# Patient Record
Sex: Male | Born: 1965 | Race: Black or African American | Hispanic: No | Smoking: Never smoker
Health system: Southern US, Community
[De-identification: ages and names within clinical notes are randomized; demographics above are authoritative.]

## PROBLEM LIST (undated history)

## (undated) DIAGNOSIS — D6851 Activated protein C resistance: Secondary | ICD-10-CM

## (undated) DIAGNOSIS — I2699 Other pulmonary embolism without acute cor pulmonale: Secondary | ICD-10-CM

## (undated) DIAGNOSIS — D869 Sarcoidosis, unspecified: Secondary | ICD-10-CM

## (undated) DIAGNOSIS — K7460 Unspecified cirrhosis of liver: Secondary | ICD-10-CM

## (undated) HISTORY — PX: CAROTID STENT: SHX1301

---

## 2001-07-06 DIAGNOSIS — D869 Sarcoidosis, unspecified: Secondary | ICD-10-CM

## 2004-05-31 ENCOUNTER — Emergency Department (HOSPITAL_COMMUNITY): Admission: EM | Admit: 2004-05-31 | Discharge: 2004-05-31 | Payer: Self-pay | Admitting: Emergency Medicine

## 2005-05-15 ENCOUNTER — Other Ambulatory Visit: Payer: Self-pay

## 2005-05-15 ENCOUNTER — Emergency Department: Payer: Self-pay | Admitting: Unknown Physician Specialty

## 2006-01-22 ENCOUNTER — Emergency Department: Payer: Self-pay | Admitting: Emergency Medicine

## 2006-07-18 ENCOUNTER — Emergency Department: Payer: Self-pay | Admitting: General Practice

## 2007-04-28 ENCOUNTER — Emergency Department: Payer: Self-pay | Admitting: Emergency Medicine

## 2007-04-28 ENCOUNTER — Other Ambulatory Visit: Payer: Self-pay

## 2007-08-09 DIAGNOSIS — I5189 Other ill-defined heart diseases: Secondary | ICD-10-CM

## 2008-07-13 ENCOUNTER — Other Ambulatory Visit: Payer: Self-pay

## 2008-07-13 ENCOUNTER — Emergency Department: Payer: Self-pay | Admitting: Emergency Medicine

## 2008-11-24 DIAGNOSIS — I749 Embolism and thrombosis of unspecified artery: Secondary | ICD-10-CM

## 2008-11-27 DIAGNOSIS — N182 Chronic kidney disease, stage 2 (mild): Secondary | ICD-10-CM | POA: Insufficient documentation

## 2008-11-27 DIAGNOSIS — I10 Essential (primary) hypertension: Secondary | ICD-10-CM

## 2009-06-28 ENCOUNTER — Emergency Department: Payer: Self-pay | Admitting: Internal Medicine

## 2009-08-08 ENCOUNTER — Emergency Department: Payer: Self-pay | Admitting: Emergency Medicine

## 2009-08-10 ENCOUNTER — Emergency Department: Payer: Self-pay | Admitting: Emergency Medicine

## 2010-12-23 ENCOUNTER — Emergency Department: Payer: Self-pay | Admitting: Unknown Physician Specialty

## 2010-12-27 ENCOUNTER — Emergency Department: Payer: Self-pay | Admitting: Unknown Physician Specialty

## 2011-10-26 ENCOUNTER — Observation Stay: Payer: Self-pay | Admitting: Specialist

## 2011-10-26 LAB — CBC
MCH: 30.9 pg (ref 26.0–34.0)
MCHC: 34.4 g/dL (ref 32.0–36.0)
Platelet: 128 10*3/uL — ABNORMAL LOW (ref 150–440)
RBC: 4.32 10*6/uL — ABNORMAL LOW (ref 4.40–5.90)

## 2011-10-26 LAB — COMPREHENSIVE METABOLIC PANEL
Albumin: 4.2 g/dL (ref 3.4–5.0)
Alkaline Phosphatase: 126 U/L (ref 50–136)
Calcium, Total: 9.7 mg/dL (ref 8.5–10.1)
Co2: 28 mmol/L (ref 21–32)
EGFR (Non-African Amer.): 36 — ABNORMAL LOW
Osmolality: 283 (ref 275–301)
SGPT (ALT): 26 U/L
Sodium: 140 mmol/L (ref 136–145)

## 2011-10-26 LAB — CK TOTAL AND CKMB (NOT AT ARMC)
CK, Total: 77 U/L (ref 35–232)
CK-MB: 1.4 ng/mL (ref 0.5–3.6)
CK-MB: 0.9 ng/mL (ref 0.5–3.6)

## 2011-10-26 LAB — TROPONIN I
Troponin-I: <0.02 ng/mL
Troponin-I: <0.02 ng/mL

## 2011-10-27 LAB — LIPID PANEL
HDL Cholesterol: 60 mg/dL (ref 40–60)
Ldl Cholesterol, Calc: 60 mg/dL (ref 0–100)
Triglycerides: 59 mg/dL (ref 0–200)
VLDL Cholesterol, Calc: 12 mg/dL (ref 5–40)

## 2011-10-27 LAB — CBC WITH DIFFERENTIAL/PLATELET
Basophil #: 0 10*3/uL (ref 0.0–0.1)
Eosinophil %: 3.5 %
HCT: 35.6 % — ABNORMAL LOW (ref 40.0–52.0)
HGB: 12.2 g/dL — ABNORMAL LOW (ref 13.0–18.0)
Lymphocyte #: 2.3 10*3/uL (ref 1.0–3.6)
MCH: 30.7 pg (ref 26.0–34.0)
MCV: 90 fL (ref 80–100)
Monocyte #: 0.5 10*3/uL (ref 0.0–0.7)
Monocyte %: 6.4 %
Neutrophil #: 4.2 10*3/uL (ref 1.4–6.5)
Platelet: 129 10*3/uL — ABNORMAL LOW (ref 150–440)
RBC: 3.97 10*6/uL — ABNORMAL LOW (ref 4.40–5.90)
WBC: 7.3 10*3/uL (ref 3.8–10.6)

## 2011-10-27 LAB — BASIC METABOLIC PANEL
Anion Gap: 10 (ref 7–16)
BUN: 24 mg/dL — ABNORMAL HIGH (ref 7–18)
Chloride: 106 mmol/L (ref 98–107)
Creatinine: 1.91 mg/dL — ABNORMAL HIGH (ref 0.60–1.30)
EGFR (Non-African Amer.): 41 — ABNORMAL LOW
Osmolality: 285 (ref 275–301)
Potassium: 4.2 mmol/L (ref 3.5–5.1)

## 2011-10-27 LAB — CK TOTAL AND CKMB (NOT AT ARMC): CK, Total: 69 U/L (ref 35–232)

## 2011-10-27 LAB — HEMOGLOBIN A1C: Hemoglobin A1C: 5.3 % (ref 4.2–6.3)

## 2012-03-18 ENCOUNTER — Emergency Department: Payer: Self-pay | Admitting: Emergency Medicine

## 2012-03-18 LAB — URINALYSIS, COMPLETE
Glucose,UR: NEGATIVE mg/dL (ref 0–75)
Nitrite: NEGATIVE
Protein: >=500
Specific Gravity: 1.01 (ref 1.003–1.030)
Squamous Epithelial: 1
WBC UR: 4 /HPF (ref 0–5)

## 2012-03-18 LAB — COMPREHENSIVE METABOLIC PANEL
Albumin: 3.5 g/dL (ref 3.4–5.0)
Bilirubin,Total: 1 mg/dL (ref 0.2–1.0)
Chloride: 110 mmol/L — ABNORMAL HIGH (ref 98–107)
Creatinine: 2.04 mg/dL — ABNORMAL HIGH (ref 0.60–1.30)
EGFR (African American): 44 — ABNORMAL LOW
EGFR (Non-African Amer.): 38 — ABNORMAL LOW
Glucose: 88 mg/dL (ref 65–99)
Osmolality: 290 (ref 275–301)
SGOT(AST): 25 U/L (ref 15–37)
Sodium: 143 mmol/L (ref 136–145)

## 2012-03-18 LAB — CBC
HGB: 11.8 g/dL — ABNORMAL LOW (ref 13.0–18.0)
MCH: 30.4 pg (ref 26.0–34.0)
MCV: 91 fL (ref 80–100)
Platelet: 135 10*3/uL — ABNORMAL LOW (ref 150–440)
WBC: 6 10*3/uL (ref 3.8–10.6)

## 2013-01-31 DIAGNOSIS — Z0000 Encounter for general adult medical examination without abnormal findings: Secondary | ICD-10-CM | POA: Insufficient documentation

## 2013-08-09 DIAGNOSIS — Z9119 Patient's noncompliance with other medical treatment and regimen: Secondary | ICD-10-CM | POA: Insufficient documentation

## 2014-03-24 DIAGNOSIS — N529 Male erectile dysfunction, unspecified: Secondary | ICD-10-CM | POA: Insufficient documentation

## 2014-05-29 DIAGNOSIS — E785 Hyperlipidemia, unspecified: Secondary | ICD-10-CM | POA: Insufficient documentation

## 2015-02-07 NOTE — H&P (Signed)
PATIENT NAME:  Christian Fuentes, Christian Fuentes MR#:  [id number] DATE OF BIRTH:  13-Sep-1966  DATE OF ADMISSION:  10/26/2011  REFERRING PHYSICIAN: Dana AllanMarwan Powers, MD    [facility]   [facility]   CHIEF COMPLAINT: Chest pain.    HISTORY OF PRESENT ILLNESS: The patient is a 49 year old African American male with a history of hypertension, chronic kidney disease, and sarcoid that is in remission, presents with chest pain that lasted less than a minute today. Of note, the  patient has been working out for the first time ever in a gym, including free weights and upper body exercises as well as lower body. Today he noted chest pain in the left chest about an hour before arrival. It was worse with movement and positional. There was no radiation, weakness, fatigue,  blurry vision, double vision, or shortness of breath or diaphoresis. On arrival here, blood pressure was elevated to 180s/110s. He has no chest pain currently. The Hospitalist Services were contacted for admission for observation overnight and ruling out cardiac chest pain.   PAST MEDICAL HISTORY:  1. Chronic kidney disease.  2. Hypertension.  3. 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  4. Chronic thrombocytopenia.   PAST SURGICAL HISTORY: The patient denies.   SOCIAL HISTORY: The patient denies tobacco, alcohol or drug use.   FAMILY HISTORY: Denies family history of coronary artery disease, hypertension, diabetes, myocardial infarction and strokes.   MEDICATIONS:  1. Amlodipine 10 mg daily.  2. Enalapril 20 mg b.i.d.   ALLERGIES: Denies.   REVIEW OF SYSTEMS: CONSTITUTIONAL: No fever, fatigue, weakness. Pain as above. No weight changes. EYES: No blurry vision, double vision or inflammation. ENT: No tinnitus or hearing loss. RESPIRATORY: No cough, wheezing, hemoptysis, or chronic obstructive pulmonary disease. CARDIOVASCULAR: Chest pain as above. No chest pain now. No  orthopnea, edema, arrhythmia, or syncope. The patient does have history of high blood pressure. GASTROINTESTINAL: No nausea, vomiting, diarrhea, abdominal pain, rectal bleeding or melena. GU: No dysuria, hematuria, or frequency. ENDOCRINE: No polyuria, nocturia, or thyroid problems. HEME/LYMPH: No anemia. SKIN: No rashes. MUSCULOSKELETAL: No history of arthritis or swelling. NEUROLOGIC: No numbness, weakness, dementia or ataxia. PSYCHIATRIC: No anxiety or insomnia.   PHYSICAL EXAMINATION:  VITAL SIGNS: Temperature 98.9, pulse 70, respiratory rate 18, blood pressure currently 151/90, on arrival 189/110, oxygen saturation 99% on room air.   GENERAL: The patient is a pleasant African American male lying in bed in no obvious distress.   HEENT: Normocephalic, atraumatic. Pupils are equal and reactive. Anicteric sclerae. Moist mucous membranes. Poor dentition.   NECK: Supple. No JVD.   CARDIOVASCULAR: S1, S2, regular rate and rhythm. No murmurs, rubs, or gallops.   LUNGS: Clear to auscultation without wheezing or rales.   ABDOMEN: Soft, nontender, no organomegaly noted.   EXTREMITIES: No significant edema. Cranial nerves II through XII are grossly intact.   NEUROLOGICAL: Strength 5 out of 5 in all extremities. Sensation intact.   PSYCHIATRIC: Awake, alert, oriented x3. Mood is appropriate.    LABORATORY, DIAGNOSTIC AND RADIOLOGICAL DATA:  Sodium 140, creatinine 2.1. It was 2.41 in September 2010, BUN 25, glucose 84, potassium 4.1. LFTs: Total bilirubin 1.3, otherwise within normal limits.  Troponin less than 0.02. CK-MB 1.4. CK total 132.  WBC 7.7, hemoglobin 13.4, hematocrit 38.3, platelets 128, was 132 in 2010.  EKG: Sinus rate 73, normal PR, QRS, and QT durations. No acute ST elevation or depressions. Nonspecific T wave abnormalities.  X-ray of the chest, PA and lateral: No significant abnormalities noted.   ASSESSMENT AND PLAN: We have a 49 year old [place] American male with a history  of hypertension, sarcoid, CKD, presents with chest pain. At this point, we will admit the patient for observation overnight and rule out myocardial infarction. The patient has hypertension and on arrival had hypertensive urgency. The patient also has sarcoid which can predispose the patient to early coronary artery disease. We would completely  rule out myocardial infarction by cyclic cardiac markers, echo and would consider a stress test. However, a more likely scenario is that the patient has a musculoskeletal injury from working out in the gym. This was her first time, and the pain is reproducible, positional. He has some tenderness to palpation on the left upper chest. I would check a fasting lipid panel and admit the patient to telemetry and would likely discharge if the above work-up is negative. For his high blood pressure, appears to be somewhat suboptimal. I would add a beta blocker and monitor his blood pressure. He does have elevated BUN and creatinine. He states that he follows with a nephrologist at [facility]. Perhaps this is chronic kidney disease, and we would monitor this in the morning. He had an elevated BUN and creatinine as well in 2010, and there are no other labs since then here. He does have some thrombocytopenia which again appears to be chronic in nature, and this would be monitored until hemoglobin and hematocrit are stable.   CODE STATUS:  The patient is a FULL CODE.     TOTAL TIME SPENT: 45 MINUTES.   ____________________________ Krystal Eaton, MD sa:cbb D: 10/26/2011 14:36:09 ET T: 10/26/2011 15:31:18 ET JOB#: [id number]  cc: Krystal Eaton, MD, <Dictator> [facility] Marcelle Smiling [facility] MD ELECTRONICALLY SIGNED 10/27/2011 20:38

## 2015-02-07 NOTE — Consult Note (Signed)
PATIENT NAME:  Christian Fuentes, Christian Fuentes MR#:  [id number] DATE OF BIRTH:  1966/01/27  DATE OF CONSULTATION:  10/27/2011  REFERRING PHYSICIAN:  Dana AllanMarwan Powers, MD  and [facility] CONSULTING PHYSICIAN:  Dwayne D. Callwood, MD PRIMARY CARE PHYSICIAN[facility]  INDICATIONS: Chest pain, hypertension and shortness of breath.    HISTORY OF PRESENT ILLNESS: Christian Fuentes is a 49 year old African-American male with a history of hypertension, chronic renal insufficiency, sarcoid disease in remission, complained of chest pain over the last several days lasting for several minutes. The patient has been working out for the first time at a gym using free weights and upper body exercises as well as lower body. He started having chest pain, left-sided, about an hour prior to arrival. It was worse with movement and positional. No radiation, weakness or fatigue, blurred vision. He had shortness of breath and diaphoresis with the chest pain, and upon arrival blood pressure was malignantly high. The chest pain has improved, but he was admitted for further evaluation.   REVIEW OF SYSTEMS: No blackout spells or syncope. No nausea or vomiting. No fever, no chills, no sweats. No weight loss or weight gain. No hemoptysis, hematemesis. No bright red blood per rectum.   PAST MEDICAL HISTORY:  1. Chronic renal insufficiency.  2. Hypertension. 3. Sarcoid.  4. Thrombocytopenia.   PAST SURGICAL HISTORY: None.   SOCIAL HISTORY: No smoking or alcohol consumption.   FAMILY HISTORY: Negative for coronary artery disease and diabetes.   MEDICATIONS:  1. Amlodipine 10 mg a day.  2. Enalapril 20 mg b.i.d.     ALLERGIES: None.   PHYSICAL EXAMINATION:  VITAL SIGNS: Blood pressure 160/90, pulse 75, respiratory rate 16, afebrile.   HEENT: Normocephalic, atraumatic. Pupils are equal and reactive to light.   NECK: Supple. No jugular venous distention, bruits, or adenopathy.   LUNGS: Clear to  auscultation and percussion. No significant wheezing or rales. Some mild rhonchi.   HEART: Regular rate and rhythm. Positive S4. Systolic ejection murmur at the left sternal border.   ABDOMEN: Exam is benign.   EXTREMITIES: Exam within normal limits.   NEUROLOGIC: Exam is intact.   SKIN: Exam is normal.   LABORATORY, DIAGNOSTIC AND RADIOLOGICAL DATA:  1. Sodium 140, creatinine 2.1.  2. LFTs essentially negative.  3. Troponin 0.02. CK 132, MB 1.4.  4. White count 7.7, hemoglobin 13, hematocrit 38, platelet count of 128.  5. EKG: Normal sinus rhythm, nonspecific ST-T wave changes.  6. Chest x-ray essentially negative.   ASSESSMENT:  1. Shortness of breath. 2. Chest pain.  3. Renal insufficiency.  4. Hypertension.  5. Sarcoid disease. 6. History of mild thrombocytopenia.   PLAN:  1. I agree with admit, rule out for myocardial infarction. Consider functional study. Follow-up cardiac enzymes. Follow-up EKG. I would consider echocardiogram.  2. Consider follow-up evaluation of renal insufficiency.  3. Control blood pressure. May increase current doses of medications.  4. Follow-up thrombocytopenia.  5. Sarcoid appears to be in remission. We will have him follow up with his regular sarcoid doctor.  6. Functional study. If that is normal, I would probably treat the patient medically for now unless symptoms persist, worsen, or recur. I would continue current therapy in the interim.   ____________________________ Bobbie Stackwayne D. Juliann Paresallwood, MD ddc:cbb D: 11/20/2011 13:47:59 ET T: 11/20/2011 17:24:59 ET JOB#: [id number]  cc: Dwayne D. Juliann Paresallwood, MD, <Dictator> Alwyn PeaWAYNE D CALLWOOD MD ELECTRONICALLY SIGNED 11/28/2011 12:51

## 2015-02-07 NOTE — Discharge Summary (Signed)
PATIENT NAME:  Christian Fuentes, Bon J MR#:  [id number] DATE OF BIRTH:  August 03, 1966  DATE OF ADMISSION:  10/26/2011 DATE OF DISCHARGE:  10/27/2011  For a detailed note, please take a look at the history and physical done on admission by Dr. Jacques NavyAhmadzia.   DIAGNOSES AT DISCHARGE:  1. Chest pain likely related to possible underlying GI issues and maybe some gas.  2. Hypertension.  3. History of sarcoidosis.   DIET: The patient is being discharged on a low sodium diet.   ACTIVITY: As tolerated.   FOLLOW-UP: [facility].   DISCHARGE MEDICATIONS:  1. Enalapril 20 mg b.i.d.  2. Amlodipine 10 mg daily.   PERTINENT STUDIES DONE DURING THE HOSPITAL COURSE: Chest x-ray done on admission showed no acute cardiopulmonary disease. A stress test on the day after admission showed submaximal study due to failure to achieve target heart rate. EKG was nondiagnostic due to failure to achieve target heart rate. Myoview showed no evidence of stress induced myocardial ischemia with submax stress. EF 53%. No wall motion abnormalities.   HOSPITAL COURSE: This is a 49 year old male with medical problems as mentioned above who presented to the hospital with chest pain.  1. Chest pain. He was observed overnight on telemetry. The patient had three sets of cardiac markers checked which were negative. He underwent a stress test, although the stress test was suboptimal but despite a suboptimal test he did not have any chest pain. He did not have any evidence of wall motion abnormalities or ischemia. Since he is currently asymptomatic, he will be discharged home. He was advised that if his chest pain recurs or if he has frequent symptoms to come back to the hospital at which point he would likely benefit from a cardiac catheterization.  2. History of sarcoidosis. The patient has no acute pulmonary issues. This is currently stable.  3. Hypertension. The patient remained hemodynamically  stable. Will resume his enalapril and his Norvasc as stated above.  4. History of chronic renal disease. The patient does have history of stage III chronic renal disease. His creatinine was pretty much at his baseline. This should be further followed up by [facility]. The patient is currently asymptomatic doing well with no active chest pain and, therefore, is being discharged home.   TIME SPENT: 35 minutes.   ____________________________ Rolly PancakeVivek J. Cherlynn KaiserSainani, MD vjs:drc D: 10/27/2011 15:46:36 ET T: 10/28/2011 15:47:17 ET JOB#: [id number]  cc: Rolly PancakeVivek J. Cherlynn KaiserSainani, MD, <Dictator> Houston SirenVIVEK J SAINANI MD ELECTRONICALLY SIGNED 10/30/2011 16:42

## 2015-02-27 ENCOUNTER — Encounter: Payer: Self-pay | Admitting: Emergency Medicine

## 2015-02-27 ENCOUNTER — Emergency Department
Admission: EM | Admit: 2015-02-27 | Discharge: 2015-02-27 | Disposition: A | Discharge status: Home / Self Care | Payer: PRIVATE HEALTH INSURANCE | Attending: Emergency Medicine | Admitting: Emergency Medicine

## 2015-02-27 ENCOUNTER — Emergency Department: Payer: PRIVATE HEALTH INSURANCE

## 2015-02-27 DIAGNOSIS — X58XXXA Exposure to other specified factors, initial encounter: Secondary | ICD-10-CM | POA: Diagnosis not present

## 2015-02-27 DIAGNOSIS — Z7901 Long term (current) use of anticoagulants: Secondary | ICD-10-CM | POA: Insufficient documentation

## 2015-02-27 DIAGNOSIS — Y998 Other external cause status: Secondary | ICD-10-CM | POA: Insufficient documentation

## 2015-02-27 DIAGNOSIS — Y9301 Activity, walking, marching and hiking: Secondary | ICD-10-CM | POA: Diagnosis not present

## 2015-02-27 DIAGNOSIS — Y9289 Other specified places as the place of occurrence of the external cause: Secondary | ICD-10-CM | POA: Diagnosis not present

## 2015-02-27 DIAGNOSIS — S93402A Sprain of unspecified ligament of left ankle, initial encounter: Secondary | ICD-10-CM | POA: Diagnosis not present

## 2015-02-27 DIAGNOSIS — S99912A Unspecified injury of left ankle, initial encounter: Secondary | ICD-10-CM | POA: Diagnosis present

## 2015-02-27 HISTORY — DX: Other pulmonary embolism without acute cor pulmonale: I26.99

## 2015-02-27 NOTE — ED Provider Notes (Signed)
[facility] Emergency Department Provider Note ?____________________________________________ ? Time seen: 1:48 PM on 02/27/2015 -----------------------------------------  I have reviewed the triage vital signs and the nursing notes. ________ HISTORY ? Chief Complaint Ankle Pain  HPI  Christian Fuentes is a 49 y.o. male who reports to the ED with acute left ankle pain, after a twisting injury today. He describes walking down the steps at a friend's house, when he missed a step, causing him to tweak his left ankle. This happened about 12 noon today. He denies any other injury. He notes some pain with ambulation and some swelling to the lateral side of the ankle. He denies a previous history of ankle problems.   Past Medical History  Diagnosis Date  . Pulmonary embolism    There are no active problems to display for this patient. ? History reviewed. No pertinent past surgical history. ? Current Outpatient Rx  Name  Route  Sig  Dispense  Refill  . warfarin (COUMADIN) 6 MG tablet   Oral   Take 6 mg by mouth daily. 6mg  [employer] [employer], Tues, Thurs & 9mg  Mon, Wed, Fri, Sat         ? Allergies Review of patient's allergies indicates no known allergies. ? History reviewed. No pertinent family history. ? Social History History  Substance Use Topics  . Smoking status: Never Smoker   . Smokeless tobacco: Never Used  . Alcohol Use: No   Review of Systems  Constitutional: Negative for fever. HEENT: Negative for head trauma, visual changes, sore throat. Cardiovascular: Negative for chest pain. Respiratory: Negative for shortness of breath. Musculoskeletal: Negative for back pain. Positive for left ankle pain. Skin: Negative for rash. Neurological: Negative for headaches, focal weakness or numbness.  10-point ROS otherwise negative. ____________________________________________  PHYSICAL EXAM:  VITAL SIGNS: ED Triage Vitals  Enc Vitals Group     BP 02/27/15  1309 143/79 mmHg     Pulse Rate 02/27/15 1309 79     Resp 02/27/15 1309 20     Temp 02/27/15 1309 97.6 F (36.4 C)     Temp Source 02/27/15 1309 Oral     SpO2 02/27/15 1309 98 %     Weight 02/27/15 1309 190 lb (86.183 kg)     Height 02/27/15 1309 5\' 9"  (1.753 m)     Head Cir --      Peak Flow --      Pain Score 02/27/15 1309 8     Pain Loc --      Pain Edu? --      Excl. in GC? --    Constitutional: Alert and oriented. Well appearing and in no distress. HEENT:Normocephalic and atraumatic.  PERRL. Normal extraocular movements.  No congestion/rhinnorhea. Mucous membranes are moist. Neck: Supple. No cervical lymphadenopathy. Cardiovascular: Normal rate, regular rhythm. No murmurs, rubs, or gallops. Normal and symmetric distal pulses are present in all extremities.  Respiratory: Normal respiratory effort without tachypnea. Breath sounds are clear and equal bilaterally. No wheezes/rales/rhonchi. Gastrointestinal: Soft and nontender. No distention. No abdominal bruits. There is no CVA tenderness. Musculoskeletal: Nontender with normal range of motion in all extremities.Left ankle shows minimal lateral soft tissue swelling at the malleolus. There is no obvious deformity, joint effusion, or erythema to the left foot and ankle patient with normal ankle range of motion, and negative drawer. He has no calf or Achilles tenderness. No lateral foot pain tenderness. Neurologic:  Normal speech and language. CN II-XII grossly intact. Mildly antalgic gait without instability. Skin:  Skin is warm, dry and intact. No rash noted. Psychiatric: Mood and affect are normal. Patient exhibits appropriate insight and judgment. ____________ RADIOLOGY  Left Ankle  IMPRESSION: Negative. _____________ PROCEDURES ? Procedure(s) performed: None  Critical Care performed: None ______________________________________________________ INITIAL IMPRESSION / ASSESSMENT AND PLAN / ED COURSE ? Grade I ankle sprain on  the left. Ace bandage applied. RICE instructions given to the patient.   Pertinent labs & imaging results that were available during my care of the patient were reviewed by me and considered in my medical decision making (see chart for details).  ____________________________________________ FINAL CLINICAL IMPRESSION(S) / ED DIAGNOSES?  Final diagnoses:  Ankle sprain, left, initial encounter      Lissa HoardJenise V Bacon Eberardo Demello, PA-C 02/27/15 1919

## 2015-02-27 NOTE — Discharge Instructions (Signed)
Ankle Sprain °An ankle sprain is an injury to the strong, fibrous tissues (ligaments) that hold the bones of your ankle joint together.  °CAUSES °An ankle sprain is usually caused by a fall or by twisting your ankle. Ankle sprains most commonly occur when you step on the outer edge of your foot, and your ankle turns inward. People who participate in sports are more prone to these types of injuries.  °SYMPTOMS  °· Pain in your ankle. The pain may be present at rest or only when you are trying to stand or walk. °· Swelling. °· Bruising. Bruising may develop immediately or within 1 to 2 days after your injury. °· Difficulty standing or walking, particularly when turning corners or changing directions. °DIAGNOSIS  °Your caregiver will ask you details about your injury and perform a physical exam of your ankle to determine if you have an ankle sprain. During the physical exam, your caregiver will press on and apply pressure to specific areas of your foot and ankle. Your caregiver will try to move your ankle in certain ways. An X-ray exam may be done to be sure a bone was not broken or a ligament did not separate from one of the bones in your ankle (avulsion fracture).  °TREATMENT  °Certain types of braces can help stabilize your ankle. Your caregiver can make a recommendation for this. Your caregiver may recommend the use of medicine for pain. If your sprain is severe, your caregiver may refer you to a surgeon who helps to restore function to parts of your skeletal system (orthopedist) or a physical therapist. °HOME CARE INSTRUCTIONS  °· Apply ice to your injury for 1-2 days or as directed by your caregiver. Applying ice helps to reduce inflammation and pain. °¨ Put ice in a plastic bag. °¨ Place a towel between your skin and the bag. °¨ Leave the ice on for 15-20 minutes at a time, every 2 hours while you are awake. °· Only take over-the-counter or prescription medicines for pain, discomfort, or fever as directed by  your caregiver. °· Elevate your injured ankle above the level of your heart as much as possible for 2-3 days. °· If your caregiver recommends crutches, use them as instructed. Gradually put weight on the affected ankle. Continue to use crutches or a cane until you can walk without feeling pain in your ankle. °· If you have a plaster splint, wear the splint as directed by your caregiver. Do not rest it on anything harder than a pillow for the first 24 hours. Do not put weight on it. Do not get it wet. You may take it off to take a shower or bath. °· You may have been given an elastic bandage to wear around your ankle to provide support. If the elastic bandage is too tight (you have numbness or tingling in your foot or your foot becomes cold and blue), adjust the bandage to make it comfortable. °· If you have an air splint, you may blow more air into it or let air out to make it more comfortable. You may take your splint off at night and before taking a shower or bath. Wiggle your toes in the splint several times per day to decrease swelling. °SEEK MEDICAL CARE IF:  °· You have rapidly increasing bruising or swelling. °· Your toes feel extremely cold or you lose feeling in your foot. °· Your pain is not relieved with medicine. °SEEK IMMEDIATE MEDICAL CARE IF: °· Your toes are numb or blue. °·   You have severe pain that is increasing. MAKE SURE YOU:   Understand these instructions.  Will watch your condition.  Will get help right away if you are not doing well or get worse. Document Released: 10/02/2005 Document Revised: 06/26/2012 Document Reviewed: 10/14/2011 [facility] Patient Information 2015 [employer] [place] [place]. This information is not intended to replace advice given to you by your health care provider. Make sure you discuss any questions you have with your health care provider.  Wear the ace bandage as needed for comfort.  Apply ice to reduce pain & swelling.  Follow-up with Dr. Hyacinth MeekerMiller as needed.

## 2015-02-27 NOTE — ED Notes (Signed)
Patient to ED with c/o left ankle pain, feels like he may have sprained it.

## 2015-04-11 DIAGNOSIS — I820 Budd-Chiari syndrome: Secondary | ICD-10-CM | POA: Insufficient documentation

## 2015-04-11 DIAGNOSIS — F32A Depression, unspecified: Secondary | ICD-10-CM | POA: Insufficient documentation

## 2015-04-11 DIAGNOSIS — F329 Major depressive disorder, single episode, unspecified: Secondary | ICD-10-CM | POA: Insufficient documentation

## 2015-11-05 ENCOUNTER — Emergency Department: Payer: PRIVATE HEALTH INSURANCE

## 2015-11-05 ENCOUNTER — Encounter: Payer: Self-pay | Admitting: Emergency Medicine

## 2015-11-05 ENCOUNTER — Emergency Department
Admission: EM | Admit: 2015-11-05 | Discharge: 2015-11-05 | Disposition: A | Discharge status: Home / Self Care | Payer: PRIVATE HEALTH INSURANCE | Attending: Emergency Medicine | Admitting: Emergency Medicine

## 2015-11-05 DIAGNOSIS — R079 Chest pain, unspecified: Secondary | ICD-10-CM | POA: Diagnosis present

## 2015-11-05 DIAGNOSIS — R0789 Other chest pain: Secondary | ICD-10-CM

## 2015-11-05 LAB — BASIC METABOLIC PANEL
ANION GAP: 6 (ref 5–15)
BUN: 21 mg/dL — ABNORMAL HIGH (ref 6–20)
CALCIUM: 9.3 mg/dL (ref 8.9–10.3)
CO2: 25 mmol/L (ref 22–32)
CREATININE: 1.81 mg/dL — AB (ref 0.61–1.24)
Chloride: 110 mmol/L (ref 101–111)
GFR calc Af Amer: 49 mL/min — ABNORMAL LOW (ref >60)
GFR calc non Af Amer: 42 mL/min — ABNORMAL LOW (ref >60)
GLUCOSE: 120 mg/dL — AB (ref 65–99)
Potassium: 3.8 mmol/L (ref 3.5–5.1)
Sodium: 141 mmol/L (ref 135–145)

## 2015-11-05 LAB — CBC
HCT: 41.7 % (ref 40.0–52.0)
Hemoglobin: 13.9 g/dL (ref 13.0–18.0)
MCH: 28.5 pg (ref 26.0–34.0)
MCHC: 33.2 g/dL (ref 32.0–36.0)
MCV: 85.6 fL (ref 80.0–100.0)
PLATELETS: 128 10*3/uL — AB (ref 150–440)
RBC: 4.88 MIL/uL (ref 4.40–5.90)
RDW: 16.3 % — AB (ref 11.5–14.5)
WBC: 6.9 10*3/uL (ref 3.8–10.6)

## 2015-11-05 LAB — TROPONIN I: Troponin I: <0.03 ng/mL (ref <0.031)

## 2015-11-05 LAB — PROTIME-INR
INR: 1.58
Prothrombin Time: 18.9 seconds — ABNORMAL HIGH (ref 11.4–15.0)

## 2015-11-05 MED ORDER — TRAMADOL HCL 50 MG PO TABS: 50.0000 mg | ORAL_TABLET | Freq: Four times a day (QID) | ORAL | Status: AC | PRN

## 2015-11-05 NOTE — ED Notes (Addendum)
Pt to ed with c/o chest pain last night that lasted a few seconds, reports sob, weakness, and lightheadedness associated with chest pain, states he was unable to come last night,  Pt currently denies chest pain. Pt appears in no acute distress at this time.  Skin warm and dry.

## 2015-11-05 NOTE — Discharge Instructions (Signed)

## 2015-11-05 NOTE — ED Provider Notes (Signed)
[facility] Emergency Department Provider Note     Time seen: ----------------------------------------- 2:12 PM on 11/05/2015 -----------------------------------------    I have reviewed the triage vital signs and the nursing notes.   HISTORY  Chief Complaint Chest Pain    HPI Christian Fuentes is a 50 y.o. male who presents ER for chest pain that started last night and last a few seconds. He reports sharp and stabbing, movement or lifting seems to make it worse. He does have some shortness of breath and weakness associated with the pain. He was unable to come in last night, he currently denies any pain. Patient states in his job he does a lot of lifting. This been going on intermittently for years. 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   Past Medical History  Diagnosis Date  . Pulmonary embolism (HCC)     There are no active problems to display for this patient.   History reviewed. No pertinent past surgical history.  Allergies Review of patient's allergies indicates no known allergies.  Social History Social History  Substance Use Topics  . Smoking status: Never Smoker   . Smokeless tobacco: Never Used  . Alcohol Use: No    Review of Systems Constitutional: Negative for fever. Eyes: Negative for visual changes. ENT: Negative for sore throat. Cardiovascular: Positive for chest pain, gone now Respiratory: Negative for shortness of breath. Gastrointestinal: Negative for abdominal pain, vomiting and diarrhea. Genitourinary: Negative for dysuria. Musculoskeletal: Negative for back pain. Skin: Negative for rash. Neurological: Negative for headaches, focal weakness or numbness.  10-point ROS otherwise negative.  ____________________________________________   PHYSICAL EXAM:  VITAL SIGNS: ED Triage Vitals  Enc Vitals Group     BP 11/05/15 1203 162/100 mmHg     Pulse Rate 11/05/15 1203 79     Resp 11/05/15 1203 20     Temp  11/05/15 1203 98.3 F (36.8 C)     Temp Source 11/05/15 1159 Oral     SpO2 11/05/15 1203 99 %     Weight 11/05/15 1159 189 lb (85.73 kg)     Height 11/05/15 1159  (1.753 m)     Head Cir --      Peak Flow --      Pain Score 11/05/15 1159 0     Pain Loc --      Pain Edu? --      Excl. in GC? --     Constitutional: Alert and oriented. Well appearing and in no distress. Eyes: Conjunctivae are normal. PERRL. Normal extraocular movements. ENT   Head: Normocephalic and atraumatic.   Nose: No congestion/rhinnorhea.   Mouth/Throat: Mucous membranes are moist.   Neck: No stridor. Cardiovascular: Normal rate, regular rhythm. Normal and symmetric distal pulses are present in all extremities. No murmurs, rubs, or gallops. Respiratory: Normal respiratory effort without tachypnea nor retractions. Breath sounds are clear and equal bilaterally. No wheezes/rales/rhonchi. Gastrointestinal: Soft and nontender. No distention. No abdominal bruits.  Musculoskeletal: Nontender with normal range of motion in all extremities. No joint effusions.  No lower extremity tenderness nor edema. Reproducible left chest wall tenderness Neurologic:  Normal speech and language. No gross focal neurologic deficits are appreciated. Speech is normal. No gait instability. Skin:  Skin is warm, dry and intact. No rash noted. Psychiatric: Mood and affect are normal. Speech and behavior are normal. Patient exhibits appropriate insight and judgment. ____________________________________________  EKG: Interpreted by me. EKG with normal sinus rhythm with rate of 85 bpm, normal PR interval,  normal QRS, normal QT interval. No evidence of acute infarction.  ____________________________________________  ED COURSE:  Pertinent labs & imaging results that were available during my care of the patient were reviewed by me and considered in my medical decision making (see chart for details). Patient is in no acute distress,  will check cardiac labs and reevaluate. ____________________________________________    LABS (pertinent positives/negatives)  Labs Reviewed  BASIC METABOLIC PANEL - Abnormal; Notable for the following:    Glucose, Bld 120 (*)    BUN 21 (*)    Creatinine, Ser 1.81 (*)    GFR calc non Af Amer 42 (*)    GFR calc Af Amer 49 (*)    All other components within normal limits  CBC - Abnormal; Notable for the following:    RDW 16.3 (*)    Platelets 128 (*)    All other components within normal limits  PROTIME-INR - Abnormal; Notable for the following:    Prothrombin Time 18.9 (*)    All other components within normal limits  TROPONIN I    RADIOLOGY  IMPRESSION: Filter in the inferior vena cava at the cavoatrial junction level. No edema or consolidation.   ____________________________________________  FINAL ASSESSMENT AND PLAN   chest wall pain   Plan: Patient with labs and imaging as dictated above. Patient is no acute distress, he has reproducible chest wall tenderness. He's been advised to take extra Coumadin dose today. He does not have symptoms consistent with PE. I will prescribe tramadol as needed for his pain, he is stable for outpatient follow-up with his doctor. Labs are otherwise stable from his prior labs  Emily Filbert, MD   Emily Filbert, MD 11/05/15 1415

## 2016-10-20 ENCOUNTER — Encounter: Payer: Self-pay | Admitting: Emergency Medicine

## 2016-10-20 ENCOUNTER — Emergency Department: Payer: PRIVATE HEALTH INSURANCE

## 2016-10-20 ENCOUNTER — Emergency Department
Admission: EM | Admit: 2016-10-20 | Discharge: 2016-10-20 | Disposition: A | Discharge status: Home / Self Care | Payer: PRIVATE HEALTH INSURANCE | Attending: Emergency Medicine | Admitting: Emergency Medicine

## 2016-10-20 DIAGNOSIS — K4090 Unilateral inguinal hernia, without obstruction or gangrene, not specified as recurrent: Secondary | ICD-10-CM | POA: Insufficient documentation

## 2016-10-20 DIAGNOSIS — R1031 Right lower quadrant pain: Secondary | ICD-10-CM | POA: Diagnosis present

## 2016-10-20 MED ORDER — OXYCODONE-ACETAMINOPHEN 5-325 MG PO TABS: 2.0000 | ORAL_TABLET | Freq: Four times a day (QID) | ORAL | 0 refills | Status: DC | PRN

## 2016-10-20 MED ORDER — OXYCODONE-ACETAMINOPHEN 5-325 MG PO TABS
2.0000 | ORAL_TABLET | Freq: Once | ORAL | Status: AC
  Administered 2016-10-20: 2 via ORAL
  Filled 2016-10-20: qty 2

## 2016-10-20 MED ORDER — DOCUSATE SODIUM 100 MG PO CAPS: 100.0000 mg | ORAL_CAPSULE | Freq: Every day | ORAL | 2 refills | Status: AC | PRN

## 2016-10-20 NOTE — ED Notes (Signed)
Patient transported to Ultrasound 

## 2016-10-20 NOTE — ED Provider Notes (Signed)
[facility] Emergency Department Provider Note        Time seen: ----------------------------------------- 9:08 AM on 10/20/2016 -----------------------------------------    I have reviewed the triage vital signs and the nursing notes.   HISTORY  Chief Complaint Groin Pain    HPI Christian Fuentes Legan is a 51 y.o. male who presents to ER for right groin pain that began yesterday.Patient states he went to the gym the night before and was working out when he noticed the pain. He does have a history of blood clots and currently takes Eliquis. Patient states the area is warm to touch at times and it hurts to walk on it at times. Denies fevers, chills or other complaints.   Past Medical History:  Diagnosis Date  . Pulmonary embolism (HCC)     There are no active problems to display for this patient.   Past Surgical History:  Procedure Laterality Date  . CAROTID STENT      Allergies Patient has no known allergies.  Social History Social History  Substance Use Topics  . Smoking status: Never Smoker  . Smokeless tobacco: Never Used  . Alcohol use No    Review of Systems Constitutional: Negative for fever. Cardiovascular: Negative for chest pain. Respiratory: Negative for shortness of breath. Gastrointestinal: Negative for abdominal pain, vomiting and diarrhea. Genitourinary: Negative for dysuria.Positive for right inguinal pain Musculoskeletal: Positive for right leg and groin pain Skin: Negative for rash. Neurological: Negative for headaches, focal weakness or numbness.  10-point ROS otherwise negative.  ____________________________________________   PHYSICAL EXAM:  VITAL SIGNS: ED Triage Vitals  Enc Vitals Group     BP 10/20/16 0851 (!) 141/90     Pulse Rate 10/20/16 0851 83     Resp 10/20/16 0851 20     Temp 10/20/16 0851 97.9 F (36.6 C)     Temp Source 10/20/16 0851 Oral     SpO2 10/20/16 0851 98 %     Weight 10/20/16 0843  184 lb (83.5 kg)     Height 10/20/16 0843 5\' 9"  (1.753 m)     Head Circumference --      Peak Flow --      Pain Score 10/20/16 0843 7     Pain Loc --      Pain Edu? --      Excl. in GC? --     Constitutional: Alert and oriented. Well appearing and in no distress. Eyes: Conjunctivae are normal. Normal extraocular movements. Gastrointestinal: Soft and nontender. Normal bowel sounds Genitourinary: Testicles are nontender, there is a right and left inguinal bulge, much greater on the right with Valsalva. Musculoskeletal: Nontender with normal range of motion in all extremities. No lower extremity tenderness nor edema. Right inguinal adenopathy is noted Neurologic:  Normal speech and language. No gross focal neurologic deficits are appreciated.  Skin:  Skin is warm, dry and intact. No rash noted. Psychiatric: Mood and affect are normal. Speech and behavior are normal.  ____________________________________________  ED COURSE:  Pertinent labs & imaging results that were available during my care of the patient were reviewed by me and considered in my medical decision making (see chart for details). Clinical Course   Procedures Patient is in no acute distress, clinically with right inguinal hernia developing. We will assess with ultrasound due to his history and reevaluate. ____________________________________________   RADIOLOGY  Right lower extremity ultrasound Is unremarkable ____________________________________________  FINAL ASSESSMENT AND PLAN  Inguinal hernia  Plan: Patient with imaging as dictated above.  Patient is in no acute distress, has a developing inguinal hernia. He'll be referred to general surgery for outpatient follow-up. We will prescribe a short supply pain medicine and stool softeners.   Emily Filbert, MD   Note: This dictation was prepared with Dragon dictation. Any transcriptional errors that result from this process are unintentional    Emily Filbert, MD 10/20/16 [phone]

## 2016-10-20 NOTE — ED Notes (Signed)
Informed patient that he will have to find a ride home with the pain medication. Patient will make some phone calls.

## 2016-10-20 NOTE — ED Notes (Signed)
Patient noticed the pain yesterday morning. Went to the gym the night before. Patient has hx of blood clots (one in liver, one in heart and one in lungs) on Eliquis. Patient states it is warm to the touch at times. Hurts to walk on it at times. Patient reports swelling to right side of groin

## 2016-10-20 NOTE — ED Triage Notes (Signed)
Pt c/o right groin pain that began yesterday, hx of blood clots, pt states area is swollen.

## 2016-11-18 DIAGNOSIS — K4090 Unilateral inguinal hernia, without obstruction or gangrene, not specified as recurrent: Secondary | ICD-10-CM | POA: Insufficient documentation

## 2016-11-18 DIAGNOSIS — K625 Hemorrhage of anus and rectum: Secondary | ICD-10-CM | POA: Insufficient documentation

## 2017-09-30 DIAGNOSIS — R053 Chronic cough: Secondary | ICD-10-CM | POA: Insufficient documentation

## 2017-09-30 DIAGNOSIS — R05 Cough: Secondary | ICD-10-CM | POA: Insufficient documentation

## 2017-10-11 ENCOUNTER — Encounter: Payer: Self-pay | Admitting: Emergency Medicine

## 2017-10-11 ENCOUNTER — Emergency Department
Admission: EM | Admit: 2017-10-11 | Discharge: 2017-10-12 | Disposition: A | Discharge status: Home / Self Care | Payer: PRIVATE HEALTH INSURANCE | Attending: Emergency Medicine | Admitting: Emergency Medicine

## 2017-10-11 DIAGNOSIS — D6851 Activated protein C resistance: Secondary | ICD-10-CM | POA: Insufficient documentation

## 2017-10-11 DIAGNOSIS — Z7901 Long term (current) use of anticoagulants: Secondary | ICD-10-CM | POA: Diagnosis not present

## 2017-10-11 DIAGNOSIS — R040 Epistaxis: Secondary | ICD-10-CM

## 2017-10-11 DIAGNOSIS — Z79899 Other long term (current) drug therapy: Secondary | ICD-10-CM | POA: Diagnosis not present

## 2017-10-11 HISTORY — DX: Activated protein C resistance: D68.51

## 2017-10-11 MED ORDER — OXYMETAZOLINE HCL 0.05 % NA SOLN
NASAL | Status: AC
  Administered 2017-10-11: 23:00:00
  Filled 2017-10-11: qty 15

## 2017-10-11 NOTE — ED Notes (Addendum)
Pt states his nose has been bleeding for past two hours. Has happened in the past but " Wont stop bleeding this time". Pt states he takes Eliquis.

## 2017-10-11 NOTE — ED Triage Notes (Signed)
Pt comes into the ED via POV c/o epistaxis x2 hours.  Patient is an eliquis patient.  Patient explains that it has been bleeding for and he can feel it in the back of his throat.  Airway intact at this time, and nose clamp placed on patient.  Patient in NAD at this time with even and unlabored respirations.

## 2017-10-12 NOTE — ED Provider Notes (Signed)
[facility] Emergency Department Provider Note   First MD Initiated Contact with Patient 10/11/17 2302     (approximate)  I have reviewed the triage vital signs and the nursing notes.   HISTORY  Chief Complaint Epistaxis    HPI Christian Fuentes is a 51 y.o. male with history of factor V Leiden currently on Eliquis presents to the emergency department with nosebleed which began 2 hours before arrival.  Patient stated bleeding is stopped at this time however was bleeding profusely before arrival.   Past Medical History:  Diagnosis Date  . Factor 5 Leiden mutation, heterozygous (HCC)   . Pulmonary embolism (HCC)     There are no active problems to display for this patient.   Past Surgical History:  Procedure Laterality Date  . CAROTID STENT      Prior to Admission medications   Medication Sig Start Date End Date Taking? Authorizing Provider  amLODipine (NORVASC) 10 MG tablet Take 10 mg by mouth daily. 09/17/15   [provider]  apixaban (ELIQUIS) 5 MG TABS tablet Take 5 mg by mouth 2 (two) times daily. 03/21/16   [provider]  docusate sodium (COLACE) 100 MG capsule Take 1 capsule (100 mg total) by mouth daily as needed. 10/20/16 10/20/17  Emily FilbertWilliams, Jonathan E, MD  oxyCODONE-acetaminophen (PERCOCET) 5-325 MG tablet Take 2 tablets by mouth every 6 (six) hours as needed for moderate pain or severe pain. 10/20/16   Emily FilbertWilliams, Jonathan E, MD    Allergies No known drug allergies  No family history on file.  Social History Social History   Tobacco Use  . Smoking status: Never Smoker  . Smokeless tobacco: Never Used  Substance Use Topics  . Alcohol use: No  . Drug use: No    Review of Systems Constitutional: No fever/chills Eyes: No visual changes. ENT: No sore throat.  Positive for nosebleed Cardiovascular: Denies chest pain. Respiratory: Denies shortness of breath. Gastrointestinal: No abdominal pain.  No nausea, no  vomiting.  No diarrhea.  No constipation. Genitourinary: Negative for dysuria. Musculoskeletal: Negative for neck pain.  Negative for back pain. Integumentary: Negative for rash. Neurological: Negative for headaches, focal weakness or numbness.   ____________________________________________   PHYSICAL EXAM:  VITAL SIGNS: ED Triage Vitals [10/11/17 2259]  Enc Vitals Group     BP (!) 149/88     Pulse Rate (!) 101     Resp 18     Temp 98.4 F (36.9 C)     Temp Source Oral     SpO2 99 %     Weight 81.6 kg (180 lb)     Height 1.753 m (5\' 9" )     Head Circumference      Peak Flow      Pain Score      Pain Loc      Pain Edu?      Excl. in GC?     Constitutional: Alert and oriented. Well appearing and in no acute distress. Eyes: Conjunctivae are normal.  Head: Atraumatic. Nose: No active bleeding noted however evidence of recent bleeding right nare Mouth/Throat: Mucous membranes are moist.  Oropharynx non-erythematous. Neck: No stridor.  Cardiovascular: Normal rate, regular rhythm. Good peripheral circulation. Grossly normal heart sounds. Respiratory: Normal respiratory effort.  No retractions. Lungs CTAB. Gastrointestinal: Soft and nontender. No distention.   Musculoskeletal: No lower extremity tenderness nor edema. No gross deformities of extremities. Neurologic:  Normal speech and language. No gross focal neurologic deficits are appreciated.  Skin:  Skin is warm, dry and intact. No rash noted. Psychiatric: Mood and affect are normal. Speech and behavior are normal.  ____________________________________________   .Epistaxis Management Date/Time: 10/12/2017 2:16 AM Performed by: Darci CurrentBrown, Loyal N, MD Authorized by: Darci CurrentBrown, [place] N, MD   Consent:    Consent obtained:  Verbal   Consent given by:  Patient   Risks discussed:  Bleeding, infection, nasal injury and pain   Alternatives discussed:  Alternative treatment Procedure details:    Treatment site:  R  anterior   Treatment method:  Merocel sponge   Treatment complexity:  Limited   Treatment episode: recurring   Post-procedure details:    Assessment:  Bleeding stopped   Patient tolerance of procedure:  Tolerated well, no immediate complications     ____________________________________________   INITIAL IMPRESSION / ASSESSMENT AND PLAN / ED COURSE  As part of my medical decision making, I reviewed the following data within the electronic MEDICAL RECORD [id number] year old male presenting with above-stated history of epistaxis currently taking Eliquis.  Afrin nasal spray introduced in bilateral nares.  Bleeding initially stopped but patient subsequently had scant drainage from the right nare and as such Merocel was inserted into the right nares with complete resolution of bleeding. ____________________________________________  FINAL CLINICAL IMPRESSION(S) / ED DIAGNOSES  Final diagnoses:  Epistaxis     MEDICATIONS GIVEN DURING THIS VISIT:  Medications  oxymetazoline (AFRIN) 0.05 % nasal spray (  Given 10/11/17 2319)     ED Discharge Orders    None       Note:  This document was prepared using Dragon voice recognition software and may include unintentional dictation errors.    Darci CurrentBrown, [place] N, MD 10/12/17 [phone]

## 2018-01-09 ENCOUNTER — Inpatient Hospital Stay
Admission: EM | Admit: 2018-01-09 | Discharge: 2018-01-10 | DRG: 432 | Disposition: A | Discharge status: Home / Self Care | Payer: Self-pay | Attending: Internal Medicine | Admitting: Internal Medicine

## 2018-01-09 ENCOUNTER — Emergency Department: Payer: Self-pay

## 2018-01-09 ENCOUNTER — Other Ambulatory Visit: Payer: Self-pay

## 2018-01-09 DIAGNOSIS — I8500 Esophageal varices without bleeding: Secondary | ICD-10-CM | POA: Diagnosis present

## 2018-01-09 DIAGNOSIS — K7460 Unspecified cirrhosis of liver: Principal | ICD-10-CM | POA: Diagnosis present

## 2018-01-09 DIAGNOSIS — Z95828 Presence of other vascular implants and grafts: Secondary | ICD-10-CM

## 2018-01-09 DIAGNOSIS — Z7901 Long term (current) use of anticoagulants: Secondary | ICD-10-CM

## 2018-01-09 DIAGNOSIS — R188 Other ascites: Secondary | ICD-10-CM | POA: Diagnosis present

## 2018-01-09 DIAGNOSIS — N183 Chronic kidney disease, stage 3 (moderate): Secondary | ICD-10-CM | POA: Diagnosis present

## 2018-01-09 DIAGNOSIS — R140 Abdominal distension (gaseous): Secondary | ICD-10-CM

## 2018-01-09 DIAGNOSIS — I820 Budd-Chiari syndrome: Secondary | ICD-10-CM | POA: Diagnosis present

## 2018-01-09 DIAGNOSIS — I81 Portal vein thrombosis: Secondary | ICD-10-CM | POA: Diagnosis present

## 2018-01-09 DIAGNOSIS — R17 Unspecified jaundice: Secondary | ICD-10-CM

## 2018-01-09 DIAGNOSIS — Z86711 Personal history of pulmonary embolism: Secondary | ICD-10-CM

## 2018-01-09 DIAGNOSIS — Z86718 Personal history of other venous thrombosis and embolism: Secondary | ICD-10-CM

## 2018-01-09 DIAGNOSIS — D6959 Other secondary thrombocytopenia: Secondary | ICD-10-CM | POA: Diagnosis present

## 2018-01-09 DIAGNOSIS — D6851 Activated protein C resistance: Secondary | ICD-10-CM | POA: Diagnosis present

## 2018-01-09 DIAGNOSIS — Z79899 Other long term (current) drug therapy: Secondary | ICD-10-CM

## 2018-01-09 LAB — COMPREHENSIVE METABOLIC PANEL
ALT: 16 U/L — ABNORMAL LOW (ref 17–63)
ANION GAP: 9 (ref 5–15)
AST: 31 U/L (ref 15–41)
Albumin: 3.1 g/dL — ABNORMAL LOW (ref 3.5–5.0)
Alkaline Phosphatase: 197 U/L — ABNORMAL HIGH (ref 38–126)
BUN: 21 mg/dL — ABNORMAL HIGH (ref 6–20)
CO2: 24 mmol/L (ref 22–32)
CREATININE: 1.71 mg/dL — AB (ref 0.61–1.24)
Calcium: 9 mg/dL (ref 8.9–10.3)
Chloride: 103 mmol/L (ref 101–111)
GFR, EST AFRICAN AMERICAN: 51 mL/min — AB (ref >60)
GFR, EST NON AFRICAN AMERICAN: 44 mL/min — AB (ref >60)
Glucose, Bld: 108 mg/dL — ABNORMAL HIGH (ref 65–99)
POTASSIUM: 3.9 mmol/L (ref 3.5–5.1)
SODIUM: 136 mmol/L (ref 135–145)
Total Bilirubin: 4.5 mg/dL — ABNORMAL HIGH (ref 0.3–1.2)
Total Protein: 7.3 g/dL (ref 6.5–8.1)

## 2018-01-09 LAB — URINALYSIS, COMPLETE (UACMP) WITH MICROSCOPIC
BILIRUBIN URINE: NEGATIVE
Bacteria, UA: NONE SEEN
Glucose, UA: NEGATIVE mg/dL
Hgb urine dipstick: NEGATIVE
KETONES UR: NEGATIVE mg/dL
LEUKOCYTES UA: NEGATIVE
Nitrite: NEGATIVE
PROTEIN: NEGATIVE mg/dL
Specific Gravity, Urine: 1.018 (ref 1.005–1.030)
pH: 6 (ref 5.0–8.0)

## 2018-01-09 LAB — CBC
HEMATOCRIT: 36.4 % — AB (ref 40.0–52.0)
HEMOGLOBIN: 12.4 g/dL — AB (ref 13.0–18.0)
MCH: 31.7 pg (ref 26.0–34.0)
MCHC: 34 g/dL (ref 32.0–36.0)
MCV: 93.3 fL (ref 80.0–100.0)
PLATELETS: 143 10*3/uL — AB (ref 150–440)
RBC: 3.91 MIL/uL — AB (ref 4.40–5.90)
RDW: 17.9 % — ABNORMAL HIGH (ref 11.5–14.5)
WBC: 6.7 10*3/uL (ref 3.8–10.6)

## 2018-01-09 LAB — LIPASE, BLOOD: LIPASE: 32 U/L (ref 11–51)

## 2018-01-09 LAB — APTT: APTT: 39 s — AB (ref 24–36)

## 2018-01-09 LAB — PROTIME-INR
INR: 1.32
Prothrombin Time: 16.3 seconds — ABNORMAL HIGH (ref 11.4–15.2)

## 2018-01-09 LAB — HEPARIN LEVEL (UNFRACTIONATED): Heparin Unfractionated: 0.18 IU/mL — ABNORMAL LOW (ref 0.30–0.70)

## 2018-01-09 MED ORDER — IOPAMIDOL (ISOVUE-300) INJECTION 61%
100.0000 mL | Freq: Once | INTRAVENOUS | Status: AC | PRN
  Administered 2018-01-09: 100 mL via INTRAVENOUS

## 2018-01-09 MED ORDER — HEPARIN (PORCINE) IN NACL 100-0.45 UNIT/ML-% IJ SOLN
1400.0000 [IU]/h | INTRAMUSCULAR | Status: DC
  Administered 2018-01-09: 1400 [IU]/h via INTRAVENOUS
  Filled 2018-01-09: qty 250

## 2018-01-09 NOTE — ED Notes (Signed)
Resumed care from shannon rn. Pt alert. meds infusing   nsr on monitor.  Pt alert.

## 2018-01-09 NOTE — Progress Notes (Signed)
ANTICOAGULATION CONSULT NOTE - Initial Consult  Pharmacy Consult for Heparin  Indication: DVT  No Known Allergies  Patient Measurements: Height: 5\' 9"  (175.3 cm) Weight: 175 lb (79.4 kg) IBW/kg (Calculated) : 70.7 Heparin Dosing Weight:  79.4 kg   Vital Signs: Temp: 98.8 F (37.1 C) (03/27 1712) Temp Source: Oral (03/27 1712) BP: 147/92 (03/27 1712) Pulse Rate: 100 (03/27 1712)  Labs: Recent Labs    01/09/18 1714  HGB 12.4*  HCT 36.4*  PLT 143*  LABPROT 16.3*  INR 1.32  CREATININE 1.71*    Estimated Creatinine Clearance: 50 mL/min (A) (by C-G formula based on SCr of 1.71 mg/dL (H)).   Medical History: Past Medical History:  Diagnosis Date  . Factor 5 Leiden mutation, heterozygous (HCC)   . Pulmonary embolism (HCC)     Medications:  Scheduled:    Assessment: Pharmacy consulted to dose heparin in this 52 year old male admitted with DVT.   Pt was on Eliquis 5 mg PO BID at home, last dose on 3/27 AM .  CrCl = 50 ml/min   Goal of Therapy:  Heparin level 0.3-0.7 units/ml Monitor platelets by anticoagulation protocol: Yes   Plan:  Eliquis 5 mg PO give on 3/27 AM .    Will not bolus this pt.  Will order baseline aptt and HL .  Start heparin infusion at 1400 units/hr Check anti-Xa level in 6 hours and daily while on heparin Continue to monitor H&H and platelets  Christian Fuentes D 01/09/2018,9:38 PM

## 2018-01-09 NOTE — ED Triage Notes (Signed)
Pt c/o abd tightness/swelling over the past 3 weeks. Denies N/V/D/fever..Marland Kitchen

## 2018-01-09 NOTE — ED Notes (Signed)
Report called to sara rn floor nurse 

## 2018-01-09 NOTE — H&P (Signed)
[facility] [place] [facility]   PATIENT NAME: Christian Fuentes    MR#:  [id number]  DATE OF BIRTH:  Feb 01, 1966  DATE OF ADMISSION:  01/09/2018  PRIMARY CARE PHYSICIAN: Clinic, General Medical   REQUESTING/REFERRING PHYSICIAN:   CHIEF COMPLAINT:   Chief Complaint  Patient presents with  . Abdominal Pain    HISTORY OF PRESENT ILLNESS: Christian Fuentes  is a 52 y.o. 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  Patient also has history of IVC stenosis, status post endovascular graft.  He is on chronic Eliquis. Patient presented to emergency room for abdominal distention and jaundice, started approximately 3 weeks ago, gradually getting worse, to the point that he has significant discomfort and reduced p.o. intake.  He does not use alcohol.  He denies any fever or chills.  No chest pain.  No bleeding.  No constipation/diarrhea.  Patient denies having similar episodes in the past. Blood test done emergency room, are notable for elevated bilirubin level at 4.5.  Creatinine level is 1.71.  Platelet count is 143.  INR is 1.32.  AST is 31 and ALT is 16.  Ammonia level is 64. Abdominal ultrasound and CAT scan, reviewed by myself, confirm liver cirrhosis and large volume ascites. Patient is admitted for further evaluation and treatment.  PAST MEDICAL HISTORY:   Past Medical History:  Diagnosis Date  . Factor 5 Leiden mutation, heterozygous (HCC)   . Pulmonary embolism (HCC)     PAST SURGICAL HISTORY:  Past Surgical History:  Procedure Laterality Date  . CAROTID STENT      SOCIAL HISTORY:  Social History   Tobacco Use  . Smoking status: Never Smoker  . Smokeless tobacco: Never Used  Substance Use Topics  . Alcohol use: No    FAMILY HISTORY: No family history on file.  DRUG ALLERGIES: No Known Allergies  REVIEW OF SYSTEMS:   CONSTITUTIONAL: No fever, the  patient complains of fatigue and generalized weakness.  EYES: No blurred or double vision.  EARS, NOSE, AND THROAT: No tinnitus or ear pain.  RESPIRATORY: No cough, wheezing or hemoptysis.  Patient admits to mild shortness of breath with exertion due to distended abdomen. CARDIOVASCULAR: No chest pain, orthopnea, edema.  GASTROINTESTINAL: Positive for abdominal distention.  No nausea, vomiting, diarrhea or abdominal pain.  GENITOURINARY: No dysuria, hematuria.  ENDOCRINE: No polyuria, nocturia,  HEMATOLOGY: No bleeding SKIN: Positive for jaundice.  No rash or lesion. MUSCULOSKELETAL: No joint pain or arthritis.   NEUROLOGIC: No focal weakness.  PSYCHIATRY: No anxiety or depression.   MEDICATIONS AT HOME:  Prior to Admission medications   Medication Sig Start Date End Date Taking? Authorizing Provider  amLODipine (NORVASC) 10 MG tablet Take 10 mg by mouth daily. 09/17/15  Yes [provider]  apixaban (ELIQUIS) 5 MG TABS tablet Take 5 mg by mouth 2 (two) times daily. 03/21/16  Yes [provider]  oxyCODONE-acetaminophen (PERCOCET) 5-325 MG tablet Take 2 tablets by mouth every 6 (six) hours as needed for moderate pain or severe pain. Patient not taking: Reported on 01/09/2018 10/20/16   Emily FilbertWilliams, Jonathan E, MD      PHYSICAL EXAMINATION:   VITAL SIGNS: Blood pressure 133/88, pulse 96, temperature 98.8 F (37.1 C), temperature source Oral, resp. rate (!) 28, height 5\' 9"  (1.753 m), weight 79.4 kg (175 lb), SpO2 98 %.  GENERAL:  52 y.o.-year-old patient lying in the bed, in mild  distress, secondary to abdominal distention.  EYES: Pupils equal, round, reactive to light and accommodation. Scleral icterus noted. Extraocular muscles intact.  HEENT: Head atraumatic, normocephalic. Oropharynx and nasopharynx clear.  NECK:  Supple, no jugular venous distention. No thyroid enlargement, no tenderness.  LUNGS: Reduced breath sounds bilaterally, no wheezing, rales,rhonchi or  crepitation. No use of accessory muscles of respiration.  CARDIOVASCULAR: S1, S2 normal. No S3/S4.  ABDOMEN: Abdomen is severely distended and tight. Bowel sounds present. No organomegaly or mass.  EXTREMITIES: No pedal edema, cyanosis, or clubbing.  NEUROLOGIC: No focal weakness.  PSYCHIATRIC: The patient is alert and oriented x 3.  SKIN: Positive for jaundice.  No obvious rash, lesion, or ulcer.   LABORATORY PANEL:   CBC Recent Labs  Lab 01/09/18 1714  WBC 6.7  HGB 12.4*  HCT 36.4*  PLT 143*  MCV 93.3  MCH 31.7  MCHC 34.0  RDW 17.9*   ------------------------------------------------------------------------------------------------------------------  Chemistries  Recent Labs  Lab 01/09/18 1714  NA 136  K 3.9  CL 103  CO2 24  GLUCOSE 108*  BUN 21*  CREATININE 1.71*  CALCIUM 9.0  AST 31  ALT 16*  ALKPHOS 197*  BILITOT 4.5*   ------------------------------------------------------------------------------------------------------------------ estimated creatinine clearance is 50 mL/min (A) (by C-G formula based on SCr of 1.71 mg/dL (H)). ------------------------------------------------------------------------------------------------------------------ No results for input(s): TSH, T4TOTAL, T3FREE, THYROIDAB in the last 72 hours.  Invalid input(s): FREET3   Coagulation profile Recent Labs  Lab 01/09/18 1714  INR 1.32   ------------------------------------------------------------------------------------------------------------------- No results for input(s): DDIMER in the last 72 hours. -------------------------------------------------------------------------------------------------------------------  Cardiac Enzymes No results for input(s): CKMB, TROPONINI, MYOGLOBIN in the last 168 hours.  Invalid input(s): CK ------------------------------------------------------------------------------------------------------------------ Invalid input(s):  POCBNP  ---------------------------------------------------------------------------------------------------------------  Urinalysis    Component Value Date/Time   COLORURINE AMBER (A) 01/09/2018 1715   APPEARANCEUR CLEAR (A) 01/09/2018 1715   APPEARANCEUR Hazy 03/18/2012 0923   LABSPEC 1.018 01/09/2018 1715   LABSPEC 1.010 03/18/2012 0923   PHURINE 6.0 01/09/2018 1715   GLUCOSEU NEGATIVE 01/09/2018 1715   GLUCOSEU Negative 03/18/2012 0923   HGBUR NEGATIVE 01/09/2018 1715   BILIRUBINUR NEGATIVE 01/09/2018 1715   BILIRUBINUR Negative 03/18/2012 0923   KETONESUR NEGATIVE 01/09/2018 1715   PROTEINUR NEGATIVE 01/09/2018 1715   NITRITE NEGATIVE 01/09/2018 1715   LEUKOCYTESUR NEGATIVE 01/09/2018 1715   LEUKOCYTESUR Negative 03/18/2012 0923     RADIOLOGY: Ct Abdomen Pelvis W Contrast  Result Date: 01/09/2018 CLINICAL DATA:  52 year old male with abdominal distention, tightness and swelling over the past 3 weeks. Cirrhosis. EXAM: CT ABDOMEN AND PELVIS WITH CONTRAST TECHNIQUE: Multidetector CT imaging of the abdomen and pelvis was performed using the standard protocol following bolus administration of intravenous contrast. CONTRAST:  ISOVUE-300 IOPAMIDOL (ISOVUE-300) INJECTION 61% COMPARISON:  Abdomen ultrasound 1929 hours today. CT Abdomen and Pelvis 07/19/2006. FINDINGS: Lower chest: Respiratory motion. Mildly to moderately elevated right hemidiaphragm. Mild right lower lobe atelectasis. Trace bilateral layering pleural effusions. No pericardial effusion. Hepatobiliary: Moderate to large volume abdominal ascites. Cirrhotic, nodular liver which is heterogeneously enhancing throughout. The gallbladder appears decompressed. Pancreas: Negative. Spleen: Mild splenomegaly with splenic length of 13.8 centimeters and estimated splenic volume of 436 mL (normal splenic volume range 83 - 412 mL). Adrenals/Urinary Tract: Normal adrenal glands. There is cortical atrophy of the left renal midpole. There  is right upper and mid pole renal cortical atrophy. Bilateral renal enhancement and contrast excretion otherwise is symmetric and within normal limits. No hydronephrosis or hydroureter. Fairly diminutive and unremarkable urinary bladder. Stomach/Bowel: Moderately gas  distended rectum. Decompressed sigmoid colon with redundancy and mild retained stool. Similar mild retained stool in the left and transverse colon. Decompressed hepatic flexure. Mild retained stool in the right colon. Decompressed terminal ileum. No dilated small bowel. Decompressed proximal stomach. Mild gas distended distal stomach. Decompressed duodenum. Vascular/Lymphatic: There is a patent vascular stent in place from the inferior cavoatrial junction through the hepatic IVC terminating along the inferior margin of the liver where IVC stenosis is noted (series 2, image 36). The IVC is patent at the level of the renal veins. The major arterial structures in the abdomen and pelvis are patent. The portal venous system appears to remain patent. No lymphadenopathy. There are bulky distal esophageal varices (series 2, image 11). There are smaller caliber gastrohepatic ligament, perisplenic and left upper quadrant mesenteric varices. Reproductive: Large scrotal hydrocele (6.5 centimeters diameter) which appears to be in communication with abdominal ascites via a patent right inguinal canal/right inguinal hernia. Other: Moderate to large volume pelvic ascites. Musculoskeletal: No acute osseous abnormality identified. IMPRESSION: 1. Cirrhotic, heterogeneously enhancing liver with moderate to large volume of ascites. Associated large right scrotal hydrocele. Mild splenomegaly. 2. Bulky esophageal varices.  Other small caliber abdominal varices. 3. 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 4. Trace pleural effusions.  Mild right lung base atelectasis. 5. Bilateral renal  cortical atrophy and scarring. Electronically Signed   By: Odessa Fleming M.D.   On: 01/09/2018 21:07   US Abdomen Limited Ruq  Result Date: 01/09/2018 CLINICAL DATA:  RIGHT upper quadrant pain. History of factor 5 Leiden mutation and pulmonary embolism. EXAM: ULTRASOUND ABDOMEN LIMITED RIGHT UPPER QUADRANT COMPARISON:  CT abdomen and pelvis July 19, 2006 FINDINGS: Gallbladder: Contracted gallbladder with wall thickening to 7 mm. Small volume pericholecystic fluid. No echogenic cholelithiasis. No pericholecystic fluid submitted. Common bile duct: Diameter: 3 mm Liver: Nodular echogenic liver. Loss of portal vein color flow without distension. Large volume ascites. IMPRESSION: 1. Cirrhosis and large volume ascites. 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 2. Contracted gallbladder with wall thickening most compatible with chronic portal hypertension. 3. Acute findings discussed with and reconfirmed by Dr.PATRICK ROBINSON on 01/09/2018 at 8:09 pm. Electronically Signed   By: Awilda Metro M.D.   On: 01/09/2018 20:11    EKG: Orders placed or performed during the hospital encounter of 11/05/15  . EKG 12-Lead  . EKG 12-Lead  . ED EKG within 10 minutes  . ED EKG within 10 minutes  . EKG    IMPRESSION AND PLAN:  1.  New onset of ascites, likely secondary to liver cirrhosis.  Would likely benefit from paracentesis.  Will consult gastroenterology for further evaluation and treatment. 2.  Advanced liver cirrhosis with esophageal varices. Will consult gastroenterology for further evaluation and treatment. 3.  CKD 3, stable, creatinine is at baseline 1.6.  Continue to monitor kidney function closely and avoid nephrotoxic medications.  4. Thrombocytopenia, likely secondary to liver cirrhosis.  Platelet count is currently at 143.  No active bleeding noted.  5.  History of recurrent PE episodes.  Will hold Eliquis for now due to possible procedure.  We will start patient on IV heparin.  All the records are  reviewed and case discussed with ED provider. Management plans discussed with the patient and he is in agreement.  CODE STATUS: FULL    TOTAL TIME TAKING CARE OF THIS PATIENT: 45 minutes.    Cammy Copa M.D on 01/09/2018  at 11:26 PM  Between 7am to 6pm - Pager - [phone]  After 6pm go to [email or web address] - password [email or web address]  [facility]  Office  [phone]  CC: Primary care physician; Clinic, General Medical

## 2018-01-09 NOTE — ED Provider Notes (Addendum)
[facility] Emergency Department Provider Note    First MD Initiated Contact with Patient 01/09/18 1849     (approximate)  I have reviewed the triage vital signs and the nursing notes.   HISTORY  Chief Complaint Abdominal Pain    HPI CLESTER CHLEBOWSKI is a 52 y.o. male with a history of cirrhosis probable Budd-Chiari as well as a factor V Leiden and history of PE IVC stenosis status post endovascular graft on chronic Eliquis presents with worsening abdominal distention and jaundice over the past 3 weeks.  Distention got to the point where he is having significant discomfort and decreased oral intake.  Denies any alcohol use.  Has been moving his bowels.  Does have some shortness of breath due to worsening abdominal distention.  States his been compliant with his medications.  Denies any blood in his stools.  No measured fevers.  Past Medical History:  Diagnosis Date  . Factor 5 Leiden mutation, heterozygous (HCC)   . Pulmonary embolism (HCC)    No family history on file. Past Surgical History:  Procedure Laterality Date  . CAROTID STENT     There are no active problems to display for this patient.     Prior to Admission medications   Medication Sig Start Date End Date Taking? Authorizing Provider  amLODipine (NORVASC) 10 MG tablet Take 10 mg by mouth daily. 09/17/15   [provider]  apixaban (ELIQUIS) 5 MG TABS tablet Take 5 mg by mouth 2 (two) times daily. 03/21/16   [provider]  oxyCODONE-acetaminophen (PERCOCET) 5-325 MG tablet Take 2 tablets by mouth every 6 (six) hours as needed for moderate pain or severe pain. 10/20/16   Emily Filbert, MD    Allergies Patient has no known allergies.    Social History Social History   Tobacco Use  . Smoking status: Never Smoker  . Smokeless tobacco: Never Used  Substance Use Topics  . Alcohol use: No  . Drug use: No    Review of Systems Patient denies headaches,  rhinorrhea, blurry vision, numbness, shortness of breath, chest pain, edema, cough, abdominal pain, nausea, vomiting, diarrhea, dysuria, fevers, rashes or hallucinations unless otherwise stated above in HPI. ____________________________________________   PHYSICAL EXAM:  VITAL SIGNS: Vitals:   01/09/18 1712  BP: (!) 147/92  Pulse: 100  Resp: 17  Temp: 98.8 F (37.1 C)  SpO2: 100%    Constitutional: Alert and oriented. ill appearing in no acute distress. Eyes: + scleral icterus Head: Atraumatic. Nose: No congestion/rhinnorhea. Mouth/Throat: Mucous membranes are moist.   Neck: No stridor. Painless ROM.  Cardiovascular: Normal rate, regular rhythm. Grossly normal heart sounds.  Good peripheral circulation. Respiratory: Normal respiratory effort.  No retractions. Lungs CTAB. Gastrointestinal: distended and taught.  No ttp, No abdominal bruits. No CVA tenderness. Genitourinary:  Musculoskeletal: No lower extremity tenderness nor edema.  No joint effusions. Neurologic:  Normal speech and language. No gross focal neurologic deficits are appreciated. No facial droop Skin:  Skin is warm, dry and intact. No rash noted. Psychiatric: Mood and affect are normal. Speech and behavior are normal.  ____________________________________________   LABS (all labs ordered are listed, but only abnormal results are displayed)  Results for orders placed or performed during the hospital encounter of 01/09/18 (from the past 24 hour(s))  Lipase, blood     Status: None   Collection Time: 01/09/18  5:14 PM  Result Value Ref Range   Lipase 32 11 - 51 U/L  Comprehensive metabolic  panel     Status: Abnormal   Collection Time: 01/09/18  5:14 PM  Result Value Ref Range   Sodium 136 135 - 145 mmol/L   Potassium 3.9 3.5 - 5.1 mmol/L   Chloride 103 101 - 111 mmol/L   CO2 24 22 - 32 mmol/L   Glucose, Bld 108 (H) 65 - 99 mg/dL   BUN 21 (H) 6 - 20 mg/dL   Creatinine, Ser 1.61 (H) 0.61 - 1.24 mg/dL    Calcium 9.0 8.9 - 09.6 mg/dL   Total Protein 7.3 6.5 - 8.1 g/dL   Albumin 3.1 (L) 3.5 - 5.0 g/dL   AST 31 15 - 41 U/L   ALT 16 (L) 17 - 63 U/L   Alkaline Phosphatase 197 (H) 38 - 126 U/L   Total Bilirubin 4.5 (H) 0.3 - 1.2 mg/dL   GFR calc non Af Amer 44 (L) >60 mL/min   GFR calc Af Amer 51 (L) >60 mL/min   Anion gap 9 5 - 15  CBC     Status: Abnormal   Collection Time: 01/09/18  5:14 PM  Result Value Ref Range   WBC 6.7 3.8 - 10.6 K/uL   RBC 3.91 (L) 4.40 - 5.90 MIL/uL   Hemoglobin 12.4 (L) 13.0 - 18.0 g/dL   HCT 04.5 (L) 40.9 - 81.1 %   MCV 93.3 80.0 - 100.0 fL   MCH 31.7 26.0 - 34.0 pg   MCHC 34.0 32.0 - 36.0 g/dL   RDW 91.4 (H) 78.2 - 95.6 %   Platelets 143 (L) 150 - 440 K/uL  Protime-INR     Status: Abnormal   Collection Time: 01/09/18  5:14 PM  Result Value Ref Range   Prothrombin Time 16.3 (H) 11.4 - 15.2 seconds   INR 1.32   Urinalysis, Complete w Microscopic     Status: Abnormal   Collection Time: 01/09/18  5:15 PM  Result Value Ref Range   Color, Urine AMBER (A) YELLOW   APPearance CLEAR (A) CLEAR   Specific Gravity, Urine 1.018 1.005 - 1.030   pH 6.0 5.0 - 8.0   Glucose, UA NEGATIVE NEGATIVE mg/dL   Hgb urine dipstick NEGATIVE NEGATIVE   Bilirubin Urine NEGATIVE NEGATIVE   Ketones, ur NEGATIVE NEGATIVE mg/dL   Protein, ur NEGATIVE NEGATIVE mg/dL   Nitrite NEGATIVE NEGATIVE   Leukocytes, UA NEGATIVE NEGATIVE   RBC / HPF 0-5 0 - 5 RBC/hpf   WBC, UA 0-5 0 - 5 WBC/hpf   Bacteria, UA NONE SEEN NONE SEEN   Squamous Epithelial / LPF 0-5 (A) NONE SEEN   Mucus PRESENT   Pregnancy, urine POC     Status: None   Collection Time: 01/09/18  9:00 PM  Result Value Ref Range   Preg Test, Ur NEGATIVE NEGATIVE   ____________________________________________ ____________________________  RADIOLOGY  I personally reviewed all radiographic images ordered to evaluate for the above acute complaints and reviewed radiology reports and findings.  These findings were personally  discussed with the patient.  Please see medical record for radiology report.  ____________________________________________   PROCEDURES  Procedure(s) performed:  .Critical Care Performed by: Willy Eddy, MD Authorized by: Willy Eddy, MD   Critical care provider statement:    Critical care time (minutes):  20   Critical care time was exclusive of:  Separately billable procedures and treating other patients   Critical care was necessary to treat or prevent imminent or life-threatening deterioration of the following conditions:  Hepatic failure   Critical care  was time spent personally by me on the following activities:  Development of treatment plan with patient or surrogate, discussions with consultants, evaluation of patient's response to treatment, examination of patient, obtaining history from patient or surrogate, ordering and performing treatments and interventions, ordering and review of laboratory studies, ordering and review of radiographic studies, pulse oximetry, re-evaluation of patient's condition and review of old charts      Critical Care performed: yes ____________________________________________   INITIAL IMPRESSION / ASSESSMENT AND PLAN / ED COURSE  Pertinent labs & imaging results that were available during my care of the patient were reviewed by me and considered in my medical decision making (see chart for details).  DDX: Cirrhosis, portal venous thrombosis, pancreatic mass, biliary obstruction, hepatitis, obstruction  Moody BruinsLawrence J Coiro is a 52 y.o. who presents to the ED with symptoms as described above.  Ultrasound of the right upper quadrant does show evidence of ascites and evidence concerning for portal venous thrombosis.  CT imaging ordered to further evaluate and exclude mass occupying lesion.  Based on large volume ascites do feel patient will require admission for large volume paracentesis.  As he has ultrasound evidence of portal vein  thrombosis with worsening ascites will start on heparin.  Have discussed with the patient and available family all diagnostics and treatments performed thus far and all questions were answered to the best of my ability. The patient demonstrates understanding and agreement with plan.       As part of my medical decision making, I reviewed the following data within the electronic MEDICAL RECORD NUMBER Nursing notes reviewed and incorporated, Labs reviewed, notes from prior ED visits.  ____________________________________________   FINAL CLINICAL IMPRESSION(S) / ED DIAGNOSES  Final diagnoses:  Abdominal distension  Ascites of liver  Jaundice  Portal vein thrombosis      NEW MEDICATIONS STARTED DURING THIS VISIT:  New Prescriptions   No medications on file     Note:  This document was prepared using Dragon voice recognition software and may include unintentional dictation errors.    Willy Eddyobinson, Tija Biss, MD 01/09/18 2113    Willy Eddyobinson, Monifa Blanchette, MD 01/09/18 2123

## 2018-01-10 ENCOUNTER — Other Ambulatory Visit: Payer: Self-pay

## 2018-01-10 ENCOUNTER — Inpatient Hospital Stay: Payer: Self-pay

## 2018-01-10 ENCOUNTER — Telehealth: Payer: Self-pay | Admitting: Gastroenterology

## 2018-01-10 DIAGNOSIS — K7469 Other cirrhosis of liver: Secondary | ICD-10-CM

## 2018-01-10 DIAGNOSIS — R188 Other ascites: Secondary | ICD-10-CM

## 2018-01-10 LAB — PATHOLOGIST SMEAR REVIEW

## 2018-01-10 LAB — BASIC METABOLIC PANEL
Anion gap: 9 (ref 5–15)
BUN: 21 mg/dL — ABNORMAL HIGH (ref 6–20)
CO2: 23 mmol/L (ref 22–32)
CREATININE: 1.6 mg/dL — AB (ref 0.61–1.24)
Calcium: 8.9 mg/dL (ref 8.9–10.3)
Chloride: 106 mmol/L (ref 101–111)
GFR, EST AFRICAN AMERICAN: 55 mL/min — AB (ref >60)
GFR, EST NON AFRICAN AMERICAN: 48 mL/min — AB (ref >60)
Glucose, Bld: 97 mg/dL (ref 65–99)
Potassium: 3.2 mmol/L — ABNORMAL LOW (ref 3.5–5.1)
SODIUM: 138 mmol/L (ref 135–145)

## 2018-01-10 LAB — CBC
HCT: 36.8 % — ABNORMAL LOW (ref 40.0–52.0)
Hemoglobin: 12.3 g/dL — ABNORMAL LOW (ref 13.0–18.0)
MCH: 31.4 pg (ref 26.0–34.0)
MCHC: 33.5 g/dL (ref 32.0–36.0)
MCV: 94 fL (ref 80.0–100.0)
PLATELETS: 143 10*3/uL — AB (ref 150–440)
RBC: 3.91 MIL/uL — AB (ref 4.40–5.90)
RDW: 17.9 % — ABNORMAL HIGH (ref 11.5–14.5)
WBC: 8.2 10*3/uL (ref 3.8–10.6)

## 2018-01-10 LAB — PROTEIN, PLEURAL OR PERITONEAL FLUID: Total protein, fluid: <3 g/dL

## 2018-01-10 LAB — ALBUMIN, PLEURAL OR PERITONEAL FLUID: Albumin, Fluid: 1.2 g/dL

## 2018-01-10 LAB — AMMONIA: Ammonia: 64 umol/L — ABNORMAL HIGH (ref 9–35)

## 2018-01-10 LAB — BODY FLUID CELL COUNT WITH DIFFERENTIAL
Eos, Fluid: 0 %
LYMPHS FL: 40 %
Monocyte-Macrophage-Serous Fluid: 52 %
Neutrophil Count, Fluid: 8 %
Total Nucleated Cell Count, Fluid: 224 cu mm

## 2018-01-10 LAB — HEPARIN LEVEL (UNFRACTIONATED)
HEPARIN UNFRACTIONATED: 0.27 [IU]/mL — AB (ref 0.30–0.70)
HEPARIN UNFRACTIONATED: 0.37 [IU]/mL (ref 0.30–0.70)

## 2018-01-10 LAB — POCT PREGNANCY, URINE: Preg Test, Ur: NEGATIVE

## 2018-01-10 LAB — GLUCOSE, CAPILLARY: GLUCOSE-CAPILLARY: 77 mg/dL (ref 65–99)

## 2018-01-10 MED ORDER — BISACODYL 5 MG PO TBEC: 5.0000 mg | DELAYED_RELEASE_TABLET | Freq: Every day | ORAL | Status: DC | PRN

## 2018-01-10 MED ORDER — DOCUSATE SODIUM 100 MG PO CAPS
100.0000 mg | ORAL_CAPSULE | Freq: Two times a day (BID) | ORAL | Status: DC
  Administered 2018-01-10: 09:00:00 100 mg via ORAL
  Filled 2018-01-10: qty 1

## 2018-01-10 MED ORDER — POTASSIUM CHLORIDE ER 20 MEQ PO TBCR: 20.0000 meq | EXTENDED_RELEASE_TABLET | Freq: Every day | ORAL | 0 refills | Status: DC

## 2018-01-10 MED ORDER — SPIRONOLACTONE 25 MG PO TABS
50.0000 mg | ORAL_TABLET | Freq: Once | ORAL | Status: AC
Start: 2018-01-10 — End: 2018-01-10
  Administered 2018-01-10: 50 mg via ORAL
  Filled 2018-01-10: qty 2

## 2018-01-10 MED ORDER — ONDANSETRON HCL 4 MG/2ML IJ SOLN: 4.0000 mg | Freq: Four times a day (QID) | INTRAMUSCULAR | Status: DC | PRN

## 2018-01-10 MED ORDER — AMLODIPINE BESYLATE 5 MG PO TABS
5.0000 mg | ORAL_TABLET | Freq: Every day | ORAL | 0 refills | Status: DC
Start: 2018-01-10 — End: 2023-10-30

## 2018-01-10 MED ORDER — AMLODIPINE BESYLATE 5 MG PO TABS
10.0000 mg | ORAL_TABLET | Freq: Every day | ORAL | Status: DC
  Administered 2018-01-10: 10 mg via ORAL
  Filled 2018-01-10: qty 2

## 2018-01-10 MED ORDER — ACETAMINOPHEN 325 MG PO TABS: 650.0000 mg | ORAL_TABLET | Freq: Four times a day (QID) | ORAL | Status: DC | PRN

## 2018-01-10 MED ORDER — HYDROCODONE-ACETAMINOPHEN 5-325 MG PO TABS: 1.0000 | ORAL_TABLET | ORAL | Status: DC | PRN

## 2018-01-10 MED ORDER — SPIRONOLACTONE 100 MG PO TABS: 100.0000 mg | ORAL_TABLET | Freq: Every day | ORAL | 0 refills | Status: DC

## 2018-01-10 MED ORDER — ONDANSETRON HCL 4 MG PO TABS
4.0000 mg | ORAL_TABLET | Freq: Four times a day (QID) | ORAL | Status: DC | PRN
Start: 2018-01-10 — End: 2018-01-10

## 2018-01-10 MED ORDER — ACETAMINOPHEN 650 MG RE SUPP: 650.0000 mg | Freq: Four times a day (QID) | RECTAL | Status: DC | PRN

## 2018-01-10 MED ORDER — POTASSIUM CHLORIDE CRYS ER 20 MEQ PO TBCR
40.0000 meq | EXTENDED_RELEASE_TABLET | Freq: Once | ORAL | Status: AC
  Administered 2018-01-10: 14:00:00 40 meq via ORAL
  Filled 2018-01-10: qty 2

## 2018-01-10 MED ORDER — FUROSEMIDE 40 MG PO TABS
40.0000 mg | ORAL_TABLET | Freq: Once | ORAL | Status: AC
  Administered 2018-01-10: 14:00:00 40 mg via ORAL
  Filled 2018-01-10: qty 1

## 2018-01-10 MED ORDER — FUROSEMIDE 40 MG PO TABS: 40.0000 mg | ORAL_TABLET | Freq: Every day | ORAL | 0 refills | Status: DC

## 2018-01-10 NOTE — Progress Notes (Signed)
Acknowledged heparin level reviewed by pharmacy and no changes in rate. Henriette CombsSarah Kenniyah Sasaki RN

## 2018-01-10 NOTE — Care Management (Signed)
[facility] to [facility] and that also where he gets his medications

## 2018-01-10 NOTE — Procedures (Signed)
US guided paracentesis.  Removed 4.4 liters of fluid.  Minimal blood loss and no immediate complication.

## 2018-01-10 NOTE — Progress Notes (Signed)
Discussed discharge instructions and medications with patient. IV removed. All questions addressed. Patient transported home via car by his friend.  Danielle Myracle Febres, RN 

## 2018-01-10 NOTE — Consult Note (Signed)
Vonda Antigua, MD [address] [place], [place], [place], [address] [address], [place], [place], [place], [address] Phone: [phone]  Fax: [phone]  Consultation  Referring Provider:     Dr. Darvin Neighbours Primary Care Physician:  Clinic, General Medical Reason for Consultation:     Abdominal pain  Date of Admission:  01/09/2018 Date of Consultation:  01/10/2018         HPI:   DRAYK HUMBARGER is a 52 y.o. male with history of liver cirrhosis, Budd-Chiari syndrome, factor V Leyden deficiency, recurrent PEs, history of IVC stenosis, status post endovascular graft, on chronic Eliquis, admitted with 3-week history of abdominal distention, jaundice.  Reports abdominal discomfort, 5/10, diffuse, with no radiation, constant, with no nausea or vomiting.  Does not use alcohol.  No fever or chills.  No constipation or diarrhea.  Hemoglobin stable at 12.3.  Platelets chronically low, 143 on this admission.  Bilirubin acutely elevated to 4.5.  Last checked in June 2017, was 1.4.  Alk phos elevated to 197.  Normal transaminases.  Albumin 3.1.  INR 1.3.  Normal lipase.  CT shows cirrhotic liver, moderate to large volume ascites, large right scrotal hydrocele, mild splenomegaly.  Bulky esophageal varices.  Abdominal varices.  Decompressed gallbladder.  Patent IVC stent and IVC stenosis. right upper quadrant ultrasound showed age-indeterminate  Portal vein thrombosis.   Past Medical History:  Diagnosis Date  . Factor 5 Leiden mutation, heterozygous ([place])   . Pulmonary embolism [facility])     Past Surgical History:  Procedure Laterality Date  . CAROTID STENT      Prior to Admission medications   Medication Sig Start Date End Date Taking? Authorizing Provider  amLODipine (NORVASC) 10 MG tablet Take 10 mg by mouth daily. 09/17/15  Yes [provider]  apixaban (ELIQUIS) 5 MG TABS tablet Take 5 mg by mouth 2 (two) times daily. 03/21/16  Yes [provider]    oxyCODONE-acetaminophen (PERCOCET) 5-325 MG tablet Take 2 tablets by mouth every 6 (six) hours as needed for moderate pain or severe pain. Patient not taking: Reported on 01/09/2018 10/20/16   Earleen Newport, MD    History reviewed. No pertinent family history.   Social History   Tobacco Use  . Smoking status: Never Smoker  . Smokeless tobacco: Never Used  Substance Use Topics  . Alcohol use: No  . Drug use: No    Allergies as of 01/09/2018  . (No Known Allergies)    Review of Systems:    All systems reviewed and negative except where noted in HPI.   Physical Exam:  Vital signs in last 24 hours: Vitals:   01/09/18 2300 01/09/18 2330 01/10/18 0015 01/10/18 0953  BP: 133/88 (!) 139/92 (!) 138/95 (!) 144/93  Pulse: 96 86 90 81  Resp: (!) 28 20  19   Temp:   98.8 F (37.1 C)   TempSrc:   Oral   SpO2: 98% 98% 98%   Weight:      Height:       Last BM Date: 01/09/18 General:   Pleasant, cooperative in NAD Head:  Normocephalic and atraumatic. Eyes:   No icterus.   Conjunctiva pink. PERRLA. Ears:  Normal auditory acuity. Neck:  Supple; no masses or thyroidomegaly Lungs: Respirations even and unlabored. Lungs clear to auscultation bilaterally.   No wheezes, crackles, or rhonchi.  Abdomen:  Soft, nondistended, nontender. Normal bowel sounds. No appreciable masses or hepatomegaly.  No rebound or guarding.  Neurologic:  Alert  and oriented x3;  grossly normal neurologically. Skin:  Intact without significant lesions or rashes. Cervical Nodes:  No significant cervical adenopathy. Psych:  Alert and cooperative. Normal affect.  LAB RESULTS: Recent Labs    01/09/18 1714 01/10/18 0103  WBC 6.7 8.2  HGB 12.4* 12.3*  HCT 36.4* 36.8*  PLT 143* 143*   BMET Recent Labs    01/09/18 1714 01/10/18 0103  NA 136 138  K 3.9 3.2*  CL 103 106  CO2 24 23  GLUCOSE 108* 97  BUN 21* 21*  CREATININE 1.71* 1.60*  CALCIUM 9.0 8.9   LFT Recent Labs    01/09/18 1714  PROT 7.3   ALBUMIN 3.1*  AST 31  ALT 16*  ALKPHOS 197*  BILITOT 4.5*   PT/INR Recent Labs    01/09/18 1714  LABPROT 16.3*  INR 1.32    STUDIES: Ct Abdomen Pelvis W Contrast  Result Date: 01/09/2018 CLINICAL DATA:  51 year old male with abdominal distention, tightness and swelling over the past 3 weeks. Cirrhosis. EXAM: CT ABDOMEN AND PELVIS WITH CONTRAST TECHNIQUE: Multidetector CT imaging of the abdomen and pelvis was performed using the standard protocol following bolus administration of intravenous contrast. CONTRAST:  137m ISOVUE-300 IOPAMIDOL (ISOVUE-300) INJECTION 61% COMPARISON:  Abdomen ultrasound 1929 hours today. CT Abdomen and Pelvis 07/19/2006. FINDINGS: Lower chest: Respiratory motion. Mildly to moderately elevated right hemidiaphragm. Mild right lower lobe atelectasis. Trace bilateral layering pleural effusions. No pericardial effusion. Hepatobiliary: Moderate to large volume abdominal ascites. Cirrhotic, nodular liver which is heterogeneously enhancing throughout. The gallbladder appears decompressed. Pancreas: Negative. Spleen: Mild splenomegaly with splenic length of 13.8 centimeters and estimated splenic volume of 436 mL (normal splenic volume range 83 - 412 mL). Adrenals/Urinary Tract: Normal adrenal glands. There is cortical atrophy of the left renal midpole. There is right upper and mid pole renal cortical atrophy. Bilateral renal enhancement and contrast excretion otherwise is symmetric and within normal limits. No hydronephrosis or hydroureter. Fairly diminutive and unremarkable urinary bladder. Stomach/Bowel: Moderately gas distended rectum. Decompressed sigmoid colon with redundancy and mild retained stool. Similar mild retained stool in the left and transverse colon. Decompressed hepatic flexure. Mild retained stool in the right colon. Decompressed terminal ileum. No dilated small bowel. Decompressed proximal stomach. Mild gas distended distal stomach. Decompressed duodenum.  Vascular/Lymphatic: There is a patent vascular stent in place from the inferior cavoatrial junction through the hepatic IVC terminating along the inferior margin of the liver where IVC stenosis is noted (series 2, image 36). The IVC is patent at the level of the renal veins. The major arterial structures in the abdomen and pelvis are patent. The portal venous system appears to remain patent. No lymphadenopathy. There are bulky distal esophageal varices (series 2, image 11). There are smaller caliber gastrohepatic ligament, perisplenic and left upper quadrant mesenteric varices. Reproductive: Large scrotal hydrocele (6.5 centimeters diameter) which appears to be in communication with abdominal ascites via a patent right inguinal canal/right inguinal hernia. Other: Moderate to large volume pelvic ascites. Musculoskeletal: No acute osseous abnormality identified. IMPRESSION: 1. Cirrhotic, heterogeneously enhancing liver with moderate to large volume of ascites. Associated large right scrotal hydrocele. Mild splenomegaly. 2. Bulky esophageal varices.  Other small caliber abdominal varices. 3. 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 4. Trace pleural effusions.  Mild right lung base atelectasis. 5. Bilateral renal cortical atrophy and scarring. Electronically Signed   By: HHerminio HeadsD.  On: 01/09/2018 21:07   US Abdomen Limited Ruq  Result Date: 01/09/2018 CLINICAL DATA:  RIGHT upper quadrant pain. History of factor 5 Leiden mutation and pulmonary embolism. EXAM: ULTRASOUND ABDOMEN LIMITED RIGHT UPPER QUADRANT COMPARISON:  CT abdomen and pelvis July 19, 2006 FINDINGS: Gallbladder: Contracted gallbladder with wall thickening to 7 mm. Small volume pericholecystic fluid. No echogenic cholelithiasis. No pericholecystic fluid submitted. Common bile duct: Diameter: 3 mm Liver: Nodular echogenic liver. Loss of portal vein color flow  without distension. Large volume ascites. IMPRESSION: 1. Cirrhosis and large volume ascites. 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 2. Contracted gallbladder with wall thickening most compatible with chronic portal hypertension. 3. Acute findings discussed with and reconfirmed by Dr.PATRICK ROBINSON on 01/09/2018 at 8:09 pm. Electronically Signed   By: Elon Alas M.D.   On: 01/09/2018 20:11      Impression / Plan:   ALAZAR CHERIAN is a 52 y.o. y/o male with history of Cirrhosis and Budd Chiari syndrome previously followed by [facility] and no recent GI follow up admitted with abdominal pain and ascites  Pt. Is s/p paracentesis today with fluid studies pending Abdominal pain much improved after paracentesis Denies alcohol use Denies previous paracentesis Awaiting fluid studies for further recommendations Pt not on diuretics at home, and based on volume status these can be started as an inpatient or outpatient. Pt appears euvolemic at this time No evidence of GI bleeding or previous history of the same Pt will need follow up with GI as an outpatient as well for cirrhosis follow up and surveillance  Thank you for involving me in the care of this patient.      LOS: 1 day   Virgel Manifold, MD  01/10/2018, 10:36 AM

## 2018-01-10 NOTE — Progress Notes (Signed)
ANTICOAGULATION CONSULT NOTE - Initial Consult  Pharmacy Consult for Heparin  Indication: DVT  No Known Allergies  Patient Measurements: Height: 5\' 9"  (175.3 cm) Weight: 175 lb (79.4 kg) IBW/kg (Calculated) : 70.7 Heparin Dosing Weight:  79.4 kg   Vital Signs: Temp: 98.8 F (37.1 C) (03/28 0015) Temp Source: Oral (03/28 0015) BP: 138/95 (03/28 0015) Pulse Rate: 90 (03/28 0015)  Labs: Recent Labs    01/09/18 1714 01/09/18 2209 01/10/18 0103 01/10/18 0512  HGB 12.4*  --  12.3*  --   HCT 36.4*  --  36.8*  --   PLT 143*  --  143*  --   APTT  --  39*  --   --   LABPROT 16.3*  --   --   --   INR 1.32  --   --   --   HEPARINUNFRC  --  0.18* 0.27* 0.37  CREATININE 1.71*  --  1.60*  --     Estimated Creatinine Clearance: 53.4 mL/min (A) (by C-G formula based on SCr of 1.6 mg/dL (H)).   Medical History: Past Medical History:  Diagnosis Date  . Factor 5 Leiden mutation, heterozygous ([facility])   . Pulmonary embolism ([facility])     Medications:  Scheduled:  . amLODipine  10 mg Oral Daily  . docusate sodium  100 mg Oral BID    Assessment: Pharmacy consulted to dose heparin in this 52 year old male admitted with DVT.   Pt was on Eliquis 5 mg PO BID at home, last dose on 3/27 AM .  CrCl = 50 ml/min   Goal of Therapy:  Heparin level 0.3-0.7 units/ml Monitor platelets by anticoagulation protocol: Yes   Plan:  Eliquis 5 mg PO give on 3/27 AM .    Will not bolus this pt.  Will order baseline aptt and HL .  Start heparin infusion at 1400 units/hr Check anti-Xa level in 6 hours and daily while on heparin Continue to monitor H&H and platelets   03/28 @ 0500 HL 0.37 therapeutic. Will continue current rate and will recheck @ 1100, CBC stable (fyi a prior HL was drawn too soon after start of drip, was ordered incorrectly).  Thomasene Rippleavid Libbie Bartley, PharmD, BCPS Clinical Pharmacist 01/10/2018

## 2018-01-10 NOTE — Telephone Encounter (Signed)
[facility] FU for pt

## 2018-01-11 LAB — PROTEIN, BODY FLUID (OTHER): Total Protein, Body Fluid Other: 2.4 g/dL

## 2018-01-11 LAB — HIV ANTIBODY (ROUTINE TESTING W REFLEX): HIV Screen 4th Generation wRfx: NONREACTIVE

## 2018-01-11 NOTE — Discharge Summary (Signed)
[facility] [place] [facility]   PATIENT NAME: Christian Fuentes    MR#:  [id number]  DATE OF BIRTH:  15-Apr-1966  DATE OF ADMISSION:  01/09/2018 ADMITTING PHYSICIAN: Cammy Copa, MD  DATE OF DISCHARGE: 01/10/2018  3:02 PM  PRIMARY CARE PHYSICIAN: Clinic, General Medical   ADMISSION DIAGNOSIS:  Portal vein thrombosis [I81] Abdominal distension [R14.0] Jaundice [R17] Ascites of liver [R18.8]  DISCHARGE DIAGNOSIS:  Active Problems:   Cirrhosis of liver (HCC)   SECONDARY DIAGNOSIS:   Past Medical History:  Diagnosis Date  . Factor 5 Leiden mutation, heterozygous ([facility])   . Pulmonary embolism ([facility])      ADMITTING HISTORY  HISTORY OF PRESENT ILLNESS: Christian Fuentes  is a 52 y.o. 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  Patient also has history of IVC stenosis, status post endovascular graft.  He is on chronic Eliquis. Patient presented to emergency room for abdominal distention and jaundice, started approximately 3 weeks ago, gradually getting worse, to the point that he has significant discomfort and reduced p.o. intake.  He does not use alcohol.  He denies any fever or chills.  No chest pain.  No bleeding.  No constipation/diarrhea.  Patient denies having similar episodes in the past. Blood test done emergency room, are notable for elevated bilirubin level at 4.5.  Creatinine level is 1.71.  Platelet count is 143.  INR is 1.32.  AST is 31 and ALT is 16.  Ammonia level is 64. Abdominal ultrasound and CAT scan, reviewed by myself, confirm liver cirrhosis and large volume ascites. Patient is admitted for further evaluation and treatment.  HOSPITAL COURSE:   *Ascites Patient was admitted to medical floor. Patient had ultrasound paracentesis done with 4.4 L of clear fluid removed.  Transudate.  Patient abdominal pain resolved.  Afebrile and normal WBC.  His ascites is  likely due to patient's cirrhosis.  He has been started on Lasix and Aldactone along with potassium supplements.  Patient has been referred to follow-up with GI as outpatient.  Patient was also found to have portal vein thrombosis on his ultrasound which is chronic.  Patient is on Eliquis.  This has been continued.  Stable for discharge home per  CONSULTS OBTAINED:  Treatment Team:  Pasty Spillers, MD  DRUG ALLERGIES:  No Known Allergies  DISCHARGE MEDICATIONS:   Allergies as of 01/10/2018   No Known Allergies     Medication List    STOP taking these medications   oxyCODONE-acetaminophen 5-325 MG tablet Commonly known as:  PERCOCET     TAKE these medications   amLODipine 5 MG tablet Commonly known as:  NORVASC Take 1 tablet (5 mg total) by mouth daily. What changed:    medication strength  how much to take   apixaban 5 MG Tabs tablet Commonly known as:  ELIQUIS Take 5 mg by mouth 2 (two) times daily.   furosemide 40 MG tablet Commonly known as:  LASIX Take 1 tablet (40 mg total) by mouth daily.   Potassium Chloride ER 20 MEQ Tbcr Take 20 mEq by mouth daily.   spironolactone 100 MG tablet Commonly known as:  ALDACTONE Take 1 tablet (100 mg total) by mouth daily.       Today   VITAL SIGNS:  Blood pressure 123/79, pulse 90, temperature 99.3 F (37.4 C), temperature source Oral, resp. rate 18, height 5\' 9"  (1.753 m), weight 79.4 kg (175 lb),  SpO2 100 %.  I/O:  No intake or output data in the 24 hours ending 01/11/18 1848  PHYSICAL EXAMINATION:  Physical Exam  GENERAL:  52 y.o.-year-old patient lying in the bed with no acute distress.  LUNGS: Normal breath sounds bilaterally, no wheezing, rales,rhonchi or crepitation. No use of accessory muscles of respiration.  CARDIOVASCULAR: S1, S2 normal. No murmurs, rubs, or gallops.  ABDOMEN: Soft, non-tender, non-distended. Bowel sounds present. No organomegaly or mass.  NEUROLOGIC: Moves all 4  extremities. PSYCHIATRIC: The patient is alert and oriented x 3.  SKIN: No obvious rash, lesion, or ulcer.   DATA REVIEW:   CBC Recent Labs  Lab 01/10/18 0103  WBC 8.2  HGB 12.3*  HCT 36.8*  PLT 143*    Chemistries  Recent Labs  Lab 01/09/18 1714 01/10/18 0103  NA 136 138  K 3.9 3.2*  CL 103 106  CO2 24 23  GLUCOSE 108* 97  BUN 21* 21*  CREATININE 1.71* 1.60*  CALCIUM 9.0 8.9  AST 31  --   ALT 16*  --   ALKPHOS 197*  --   BILITOT 4.5*  --     Cardiac Enzymes No results for input(s): TROPONINI in the last 168 hours.  Microbiology Results  Results for orders placed or performed during the hospital encounter of 01/09/18  Body fluid culture     Status: None (Preliminary result)   Collection Time: 01/10/18 10:22 AM  Result Value Ref Range Status   Specimen Description   Final    PERITONEAL Performed at [facility], [address] [place], [place] [address]    Special Requests   Final    NONE Performed at [facility], [address] [place], [place] [address]    Gram Stain   Final    MODERATE WBC PRESENT, PREDOMINANTLY MONONUCLEAR NO ORGANISMS SEEN    Culture   Final    NO GROWTH 1 DAY Performed at [facility], [address], [place], [place] [address]    Report Status PENDING  Incomplete    RADIOLOGY:  Ct Abdomen Pelvis W Contrast  Result Date: 01/09/2018 CLINICAL DATA:  52 year old male with abdominal distention, tightness and swelling over the past 3 weeks. Cirrhosis. EXAM: CT ABDOMEN AND PELVIS WITH CONTRAST TECHNIQUE: Multidetector CT imaging of the abdomen and pelvis was performed using the standard protocol following bolus administration of intravenous contrast. CONTRAST:  ISOVUE-300 IOPAMIDOL (ISOVUE-300) INJECTION 61% COMPARISON:  Abdomen ultrasound 1929 hours today. CT Abdomen and Pelvis 07/19/2006. FINDINGS: Lower chest: Respiratory motion. Mildly to moderately elevated right hemidiaphragm. Mild right lower  lobe atelectasis. Trace bilateral layering pleural effusions. No pericardial effusion. Hepatobiliary: Moderate to large volume abdominal ascites. Cirrhotic, nodular liver which is heterogeneously enhancing throughout. The gallbladder appears decompressed. Pancreas: Negative. Spleen: Mild splenomegaly with splenic length of 13.8 centimeters and estimated splenic volume of 436 mL (normal splenic volume range 83 - 412 mL). Adrenals/Urinary Tract: Normal adrenal glands. There is cortical atrophy of the left renal midpole. There is right upper and mid pole renal cortical atrophy. Bilateral renal enhancement and contrast excretion otherwise is symmetric and within normal limits. No hydronephrosis or hydroureter. Fairly diminutive and unremarkable urinary bladder. Stomach/Bowel: Moderately gas distended rectum. Decompressed sigmoid colon with redundancy and mild retained stool. Similar mild retained stool in the left and transverse colon. Decompressed hepatic flexure. Mild retained stool in the right colon. Decompressed terminal ileum. No dilated small bowel. Decompressed proximal stomach. Mild gas distended distal stomach. Decompressed duodenum.  Vascular/Lymphatic: There is a patent vascular stent in place from the inferior cavoatrial junction through the hepatic IVC terminating along the inferior margin of the liver where IVC stenosis is noted (series 2, image 36). The IVC is patent at the level of the renal veins. The major arterial structures in the abdomen and pelvis are patent. The portal venous system appears to remain patent. No lymphadenopathy. There are bulky distal esophageal varices (series 2, image 11). There are smaller caliber gastrohepatic ligament, perisplenic and left upper quadrant mesenteric varices. Reproductive: Large scrotal hydrocele (6.5 centimeters diameter) which appears to be in communication with abdominal ascites via a patent right inguinal canal/right inguinal hernia. Other: Moderate to large  volume pelvic ascites. Musculoskeletal: No acute osseous abnormality identified. IMPRESSION: 1. Cirrhotic, heterogeneously enhancing liver with moderate to large volume of ascites. Associated large right scrotal hydrocele. Mild splenomegaly. 2. Bulky esophageal varices.  Other small caliber abdominal varices. 3. 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 4. Trace pleural effusions.  Mild right lung base atelectasis. 5. Bilateral renal cortical atrophy and scarring. Electronically Signed   By: Odessa Fleming M.D.   On: 01/09/2018 21:07   US Paracentesis  Result Date: 01/10/2018 INDICATION: 52 year old with cirrhosis and ascites. Request for therapeutic and diagnostic paracentesis. EXAM: ULTRASOUND GUIDED PARACENTESIS MEDICATIONS: None. COMPLICATIONS: None immediate. PROCEDURE: Informed written consent was obtained from the patient after a discussion of the risks, benefits and alternatives to treatment. A timeout was performed prior to the initiation of the procedure. Initial ultrasound scanning demonstrates a large amount of ascites within the right lower abdominal quadrant. The right lower abdomen was prepped and draped in the usual sterile fashion. 1% lidocaine was used for local anesthesia. Following this, a 6 Fr Safe-T-Centesis catheter was introduced. An ultrasound image was saved for documentation purposes. The paracentesis was performed. The catheter was removed and a dressing was applied. The patient tolerated the procedure well without immediate post procedural complication. FINDINGS: A total of approximately 4.4 L of clear amber colored fluid was removed. Samples were sent to the laboratory as requested by the clinical team. IMPRESSION: Successful ultrasound-guided paracentesis yielding 4.4 liters of peritoneal fluid. Electronically Signed   By: Richarda Overlie M.D.   On: 01/10/2018 11:31   US Abdomen Limited Ruq  Result Date:  01/09/2018 CLINICAL DATA:  RIGHT upper quadrant pain. History of factor 5 Leiden mutation and pulmonary embolism. EXAM: ULTRASOUND ABDOMEN LIMITED RIGHT UPPER QUADRANT COMPARISON:  CT abdomen and pelvis July 19, 2006 FINDINGS: Gallbladder: Contracted gallbladder with wall thickening to 7 mm. Small volume pericholecystic fluid. No echogenic cholelithiasis. No pericholecystic fluid submitted. Common bile duct: Diameter: 3 mm Liver: Nodular echogenic liver. Loss of portal vein color flow without distension. Large volume ascites. IMPRESSION: 1. Cirrhosis and large volume ascites. 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 2. Contracted gallbladder with wall thickening most compatible with chronic portal hypertension. 3. Acute findings discussed with and reconfirmed by Dr.PATRICK ROBINSON on 01/09/2018 at 8:09 pm. Electronically Signed   By: Awilda Metro M.D.   On: 01/09/2018 20:11    Follow up with PCP in 1 week.  Management plans discussed with the patient, family and they are in agreement.  CODE STATUS:  Code Status History    Date Active Date Inactive Code Status Order ID Comments User Context   01/10/2018 0003 01/10/2018 1807 Full Code [id number]  Cammy Copa, MD ED      TOTAL  TIME TAKING CARE OF THIS PATIENT ON DAY OF DISCHARGE: more than 30 minutes.   Molinda BailiffSrikar R Esten Dollar M.D on 01/11/2018 at 6:48 PM  Between 7am to 6pm - Pager - [phone]  After 6pm go to [email or web address] - password [email or web address]  [facility]  Office  [phone]  CC: Primary care physician; Clinic, General Medical  Note: This dictation was prepared with Dragon dictation along with smaller phrase technology. Any transcriptional errors that result from this process are unintentional.

## 2018-01-13 LAB — BODY FLUID CULTURE: Culture: NO GROWTH

## 2018-01-17 ENCOUNTER — Telehealth: Payer: Self-pay

## 2018-01-17 NOTE — Telephone Encounter (Signed)
[facility] Follow-up: Noted on the report that pt. had some unfilled Rx's so just following up. Left a VM for him to call me at his convenience.

## 2018-01-31 ENCOUNTER — Other Ambulatory Visit: Payer: Self-pay

## 2018-01-31 ENCOUNTER — Other Ambulatory Visit
Admission: RE | Admit: 2018-01-31 | Discharge: 2018-01-31 | Disposition: A | Discharge status: Home / Self Care | Payer: BLUE CROSS/BLUE SHIELD | Source: Ambulatory Visit | Attending: Gastroenterology | Admitting: Gastroenterology

## 2018-01-31 ENCOUNTER — Encounter: Payer: Self-pay | Admitting: *Deleted

## 2018-01-31 ENCOUNTER — Encounter: Payer: Self-pay | Admitting: Gastroenterology

## 2018-01-31 ENCOUNTER — Ambulatory Visit (INDEPENDENT_AMBULATORY_CARE_PROVIDER_SITE_OTHER): Payer: BLUE CROSS/BLUE SHIELD | Admitting: Gastroenterology

## 2018-01-31 VITALS — BP 112/69 | HR 86 | Ht 71.0 in | Wt 175.0 lb

## 2018-01-31 DIAGNOSIS — K7460 Unspecified cirrhosis of liver: Secondary | ICD-10-CM | POA: Diagnosis not present

## 2018-01-31 DIAGNOSIS — R188 Other ascites: Secondary | ICD-10-CM

## 2018-01-31 LAB — COMPREHENSIVE METABOLIC PANEL
ALBUMIN: 3.5 g/dL (ref 3.5–5.0)
ALK PHOS: 199 U/L — AB (ref 38–126)
ALT: 29 U/L (ref 17–63)
ANION GAP: 12 (ref 5–15)
AST: 45 U/L — ABNORMAL HIGH (ref 15–41)
BILIRUBIN TOTAL: 5 mg/dL — AB (ref 0.3–1.2)
BUN: 40 mg/dL — AB (ref 6–20)
CALCIUM: 9 mg/dL (ref 8.9–10.3)
CO2: 25 mmol/L (ref 22–32)
Chloride: 98 mmol/L — ABNORMAL LOW (ref 101–111)
Creatinine, Ser: 2.14 mg/dL — ABNORMAL HIGH (ref 0.61–1.24)
GFR calc Af Amer: 39 mL/min — ABNORMAL LOW (ref >60)
GFR calc non Af Amer: 34 mL/min — ABNORMAL LOW (ref >60)
GLUCOSE: 110 mg/dL — AB (ref 65–99)
Potassium: 5.3 mmol/L — ABNORMAL HIGH (ref 3.5–5.1)
Sodium: 135 mmol/L (ref 135–145)
TOTAL PROTEIN: 8.3 g/dL — AB (ref 6.5–8.1)

## 2018-01-31 MED ORDER — NADOLOL 40 MG PO TABS: 40.0000 mg | ORAL_TABLET | Freq: Every day | ORAL | 6 refills | Status: DC

## 2018-01-31 MED ORDER — FUROSEMIDE 40 MG PO TABS: 40.0000 mg | ORAL_TABLET | Freq: Every day | ORAL | 6 refills | Status: DC

## 2018-01-31 MED ORDER — SPIRONOLACTONE 100 MG PO TABS: 100.0000 mg | ORAL_TABLET | Freq: Every day | ORAL | 6 refills | Status: DC

## 2018-01-31 NOTE — Progress Notes (Signed)
Gastroenterology Consultation  Referring Provider:     Clinic, General Medical Primary Care Physician:  Clinic, General Medical Primary Gastroenterologist:  Dr. Servando Snare     Reason for Consultation:     Cirrhosis        HPI:   Christian Fuentes is a 52 y.o. y/o male referred for consultation & management of cirrhosis by Dr. Skipper Cliche, General Medical.  This patient comes in after being discharged from the hospital recently with a known history of cirrhosis.  The patient was admitted due to abdominal distention and abdominal pain and was found to have ascites.  The patient is on chronic Eliquis for Budd-Chiari syndrome.  The patient also has a known factor V Leiden deficiency and recurrent pulmonary emboli.  The patient also has a history of an IVC filter and IVC stenosis.  The patient was in the hospital and a consults was called but the patient had not been seen by GI.  The patient had a paracentesis with 4.4 L of fluid removed. 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  The patient also reports that he has not reaccumulated his ascites since being put on diuretics.  The patient does not have any refills left on his diuretics.  He also was found to have large varices on his CT scan.    Past Medical History:  Diagnosis Date  . Factor 5 Leiden mutation, heterozygous (HCC)   . Pulmonary embolism [facility])     Past Surgical History:  Procedure Laterality Date  . CAROTID STENT      Prior to Admission medications   Medication Sig Start Date End Date Taking? Authorizing Provider  amLODipine (NORVASC) 5 MG tablet Take 1 tablet (5 mg total) by mouth daily. 01/10/18   Milagros Loll, MD  apixaban (ELIQUIS) 5 MG TABS tablet Take 5 mg by mouth 2 (two) times daily. 03/21/16   [provider]  furosemide (LASIX) 40 MG tablet Take 1 tablet (40 mg total) by mouth daily. 01/10/18  02/09/18  Milagros Loll, MD  potassium chloride 20 MEQ TBCR Take 20 mEq by mouth daily. 01/10/18   Milagros Loll, MD  spironolactone (ALDACTONE) 100 MG tablet Take 1 tablet (100 mg total) by mouth daily. 01/10/18 02/09/18  Milagros Loll, MD    No family history on file.   Social History   Tobacco Use  . Smoking status: Never Smoker  . Smokeless tobacco: Never Used  Substance Use Topics  . Alcohol use: No  . Drug use: No    Allergies as of 01/31/2018  . (No Known Allergies)    Review of Systems:    All systems reviewed and negative except where noted in HPI.   Physical Exam:  There were no vitals taken for this visit. No LMP for male patient. Psych:  Alert and cooperative. Normal mood and affect. General:   Alert,  Well-developed, well-nourished, pleasant and cooperative in NAD Head:  Normocephalic and atraumatic. Eyes:  Sclera clear, no icterus.   Conjunctiva pink. Ears:  Normal auditory acuity. Nose:  No deformity, discharge, or lesions. Mouth:  No deformity or lesions,oropharynx pink & moist. Neck:  Supple; no masses or thyromegaly. Lungs:  Respirations even and unlabored.  Clear throughout to auscultation.   No wheezes, crackles, or rhonchi. No acute distress. Heart:  Regular rate and rhythm; no murmurs, clicks, rubs, or gallops. Abdomen:  Normal  bowel sounds.  No bruits.  Soft, non-tender and non-distended without masses, hepatosplenomegaly or hernias noted.  No guarding or rebound tenderness.  Negative Carnett sign.   Rectal:  Deferred.  Msk:  Symmetrical without gross deformities.  Good, equal movement & strength bilaterally. Pulses:  Normal pulses noted. Extremities:  No clubbing or edema.  No cyanosis. Neurologic:  Alert and oriented x3;  grossly normal neurologically. Skin:  Intact without significant lesions or rashes.  No jaundice. Lymph Nodes:  No significant cervical adenopathy. Psych:  Alert and cooperative. Normal mood and affect.  Imaging Studies: Ct  Abdomen Pelvis W Contrast  Result Date: 01/09/2018 CLINICAL DATA:  52 year old male with abdominal distention, tightness and swelling over the past 3 weeks. Cirrhosis. EXAM: CT ABDOMEN AND PELVIS WITH CONTRAST TECHNIQUE: Multidetector CT imaging of the abdomen and pelvis was performed using the standard protocol following bolus administration of intravenous contrast. CONTRAST:  100mL ISOVUE-300 IOPAMIDOL (ISOVUE-300) INJECTION 61% COMPARISON:  Abdomen ultrasound 1929 hours today. CT Abdomen and Pelvis 07/19/2006. FINDINGS: Lower chest: Respiratory motion. Mildly to moderately elevated right hemidiaphragm. Mild right lower lobe atelectasis. Trace bilateral layering pleural effusions. No pericardial effusion. Hepatobiliary: Moderate to large volume abdominal ascites. Cirrhotic, nodular liver which is heterogeneously enhancing throughout. The gallbladder appears decompressed. Pancreas: Negative. Spleen: Mild splenomegaly with splenic length of 13.8 centimeters and estimated splenic volume of 436 mL (normal splenic volume range 83 - 412 mL). Adrenals/Urinary Tract: Normal adrenal glands. There is cortical atrophy of the left renal midpole. There is right upper and mid pole renal cortical atrophy. Bilateral renal enhancement and contrast excretion otherwise is symmetric and within normal limits. No hydronephrosis or hydroureter. Fairly diminutive and unremarkable urinary bladder. Stomach/Bowel: Moderately gas distended rectum. Decompressed sigmoid colon with redundancy and mild retained stool. Similar mild retained stool in the left and transverse colon. Decompressed hepatic flexure. Mild retained stool in the right colon. Decompressed terminal ileum. No dilated small bowel. Decompressed proximal stomach. Mild gas distended distal stomach. Decompressed duodenum. Vascular/Lymphatic: There is a patent vascular stent in place from the inferior cavoatrial junction through the hepatic IVC terminating along the inferior  margin of the liver where IVC stenosis is noted (series 2, image 36). The IVC is patent at the level of the renal veins. The major arterial structures in the abdomen and pelvis are patent. The portal venous system appears to remain patent. No lymphadenopathy. There are bulky distal esophageal varices (series 2, image 11). There are smaller caliber gastrohepatic ligament, perisplenic and left upper quadrant mesenteric varices. Reproductive: Large scrotal hydrocele (6.5 centimeters diameter) which appears to be in communication with abdominal ascites via a patent right inguinal canal/right inguinal hernia. Other: Moderate to large volume pelvic ascites. Musculoskeletal: No acute osseous abnormality identified. IMPRESSION: 1. Cirrhotic, heterogeneously enhancing liver with moderate to large volume of ascites. Associated large right scrotal hydrocele. Mild splenomegaly. 2. Bulky esophageal varices.  Other small caliber abdominal varices. 3. 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 4. Trace pleural effusions.  Mild right lung base atelectasis. 5. Bilateral renal cortical atrophy and scarring. Electronically Signed   By: Odessa FlemingH  Hall M.D.   On: 01/09/2018 21:07   [place] Paracentesis  Result Date: 01/10/2018 INDICATION: 52 year old with cirrhosis and ascites. Request for therapeutic and diagnostic paracentesis. EXAM: ULTRASOUND GUIDED PARACENTESIS MEDICATIONS: None. COMPLICATIONS: None immediate. PROCEDURE: Informed written consent was obtained from the patient after a discussion of the risks, benefits and alternatives  to treatment. A timeout was performed prior to the initiation of the procedure. Initial ultrasound scanning demonstrates a large amount of ascites within the right lower abdominal quadrant. The right lower abdomen was prepped and draped in the usual sterile fashion. 1% lidocaine was used for local anesthesia. Following this, a 6  Fr Safe-T-Centesis catheter was introduced. An ultrasound image was saved for documentation purposes. The paracentesis was performed. The catheter was removed and a dressing was applied. The patient tolerated the procedure well without immediate post procedural complication. FINDINGS: A total of approximately 4.4 L of clear amber colored fluid was removed. Samples were sent to the laboratory as requested by the clinical team. IMPRESSION: Successful ultrasound-guided paracentesis yielding 4.4 liters of peritoneal fluid. Electronically Signed   By: Richarda Overlie M.D.   On: 01/10/2018 11:31   US Abdomen Limited Ruq  Result Date: 01/09/2018 CLINICAL DATA:  RIGHT upper quadrant pain. History of factor 5 Leiden mutation and pulmonary embolism. EXAM: ULTRASOUND ABDOMEN LIMITED RIGHT UPPER QUADRANT COMPARISON:  CT abdomen and pelvis July 19, 2006 FINDINGS: Gallbladder: Contracted gallbladder with wall thickening to 7 mm. Small volume pericholecystic fluid. No echogenic cholelithiasis. No pericholecystic fluid submitted. Common bile duct: Diameter: 3 mm Liver: Nodular echogenic liver. Loss of portal vein color flow without distension. Large volume ascites. IMPRESSION: 1. Cirrhosis and large volume ascites. 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 2. Contracted gallbladder with wall thickening most compatible with chronic portal hypertension. 3. Acute findings discussed with and reconfirmed by Dr.PATRICK ROBINSON on 01/09/2018 at 8:09 pm. Electronically Signed   By: Awilda Metro M.D.   On: 01/09/2018 20:11    Assessment and Plan:   Christian Fuentes is a 52 y.o. y/o male who has cirrhosis and reports his from his multiple clots over the years.  He states he was diagnosed at [facility] many years ago with cirrhosis. The patient has not followed up with anybody for his cirrhosis.  He recently had a visit the hospital for ascites.  The patient will be started on now to low all and will have his CMP checked today.  The  patient will have his other medications such as his Aldactone and Lasix refilled.  The patient will also have his blood centile for alpha-fetoprotein.  The patient will follow-up every 6 months with me for [facility] surveillance.  Midge Minium, MD. Clementeen Graham   Note: This dictation was prepared with Dragon dictation along with smaller phrase technology. Any transcriptional errors that result from this process are unintentional.

## 2018-02-01 LAB — AFP TUMOR MARKER: AFP, SERUM, TUMOR MARKER: 4.1 ng/mL (ref 0.0–8.3)

## 2018-02-07 ENCOUNTER — Other Ambulatory Visit: Payer: Self-pay

## 2018-02-07 ENCOUNTER — Telehealth: Payer: Self-pay | Admitting: Gastroenterology

## 2018-02-07 DIAGNOSIS — R188 Other ascites: Principal | ICD-10-CM

## 2018-02-07 DIAGNOSIS — K7460 Unspecified cirrhosis of liver: Secondary | ICD-10-CM

## 2018-02-07 MED ORDER — SPIRONOLACTONE 100 MG PO TABS: 100.0000 mg | ORAL_TABLET | Freq: Every day | ORAL | 6 refills | Status: DC

## 2018-02-07 MED ORDER — FUROSEMIDE 40 MG PO TABS: 40.0000 mg | ORAL_TABLET | Freq: Every day | ORAL | 6 refills | Status: DC

## 2018-02-07 NOTE — Telephone Encounter (Signed)
Pt left vm he needs refill on rx

## 2018-02-07 NOTE — Telephone Encounter (Signed)
rx fluid pills spronaltone 100 mg and rx  fursomid  [address] and call pt back

## 2018-02-14 ENCOUNTER — Telehealth: Payer: Self-pay

## 2018-02-14 NOTE — Telephone Encounter (Signed)
Left vm with lab results. Advised pt of 6 month follow up appt. Pt appt added to recall list.

## 2018-02-14 NOTE — Telephone Encounter (Signed)
-----   Message from Midge Minium, MD sent at 02/04/2018 12:33 PM EDT ----- Let the patient know that his tumor marker was negative and he needs to follow-up in 6 months.

## 2018-04-17 ENCOUNTER — Inpatient Hospital Stay
Admission: EM | Admit: 2018-04-17 | Discharge: 2018-04-18 | DRG: 377 | Disposition: A | Discharge status: Other Acute Inpatient Hospital | Payer: BLUE CROSS/BLUE SHIELD | Attending: Emergency Medicine | Admitting: Emergency Medicine

## 2018-04-17 ENCOUNTER — Other Ambulatory Visit: Payer: Self-pay

## 2018-04-17 DIAGNOSIS — K920 Hematemesis: Secondary | ICD-10-CM | POA: Diagnosis not present

## 2018-04-17 DIAGNOSIS — N179 Acute kidney failure, unspecified: Secondary | ICD-10-CM | POA: Diagnosis present

## 2018-04-17 DIAGNOSIS — I8500 Esophageal varices without bleeding: Secondary | ICD-10-CM | POA: Diagnosis present

## 2018-04-17 DIAGNOSIS — R739 Hyperglycemia, unspecified: Secondary | ICD-10-CM | POA: Diagnosis present

## 2018-04-17 DIAGNOSIS — K922 Gastrointestinal hemorrhage, unspecified: Secondary | ICD-10-CM

## 2018-04-17 DIAGNOSIS — Z7901 Long term (current) use of anticoagulants: Secondary | ICD-10-CM

## 2018-04-17 DIAGNOSIS — K7460 Unspecified cirrhosis of liver: Secondary | ICD-10-CM | POA: Diagnosis present

## 2018-04-17 DIAGNOSIS — R578 Other shock: Secondary | ICD-10-CM

## 2018-04-17 DIAGNOSIS — N183 Chronic kidney disease, stage 3 (moderate): Secondary | ICD-10-CM | POA: Diagnosis present

## 2018-04-17 DIAGNOSIS — Z86718 Personal history of other venous thrombosis and embolism: Secondary | ICD-10-CM

## 2018-04-17 DIAGNOSIS — Z86711 Personal history of pulmonary embolism: Secondary | ICD-10-CM

## 2018-04-17 DIAGNOSIS — D6851 Activated protein C resistance: Secondary | ICD-10-CM | POA: Diagnosis present

## 2018-04-17 DIAGNOSIS — K7469 Other cirrhosis of liver: Secondary | ICD-10-CM | POA: Diagnosis not present

## 2018-04-17 HISTORY — DX: Unspecified cirrhosis of liver: K74.60

## 2018-04-17 MED ORDER — PANTOPRAZOLE SODIUM 40 MG IV SOLR: 40.0000 mg | Freq: Two times a day (BID) | INTRAVENOUS | Status: DC

## 2018-04-17 MED ORDER — TRANEXAMIC ACID 1000 MG/10ML IV SOLN
1000.0000 mg | Freq: Once | INTRAVENOUS | Status: AC
  Administered 2018-04-18: 1000 mg via INTRAVENOUS
  Filled 2018-04-17: qty 10

## 2018-04-17 MED ORDER — SODIUM CHLORIDE 0.9 % IV SOLN
50.0000 ug/h | INTRAVENOUS | Status: DC
  Administered 2018-04-18: 50 ug/h via INTRAVENOUS
  Filled 2018-04-17 (×2): qty 1

## 2018-04-17 MED ORDER — SODIUM CHLORIDE 0.9 % IV SOLN
8.0000 mg/h | INTRAVENOUS | Status: DC
  Administered 2018-04-18: 8 mg/h via INTRAVENOUS
  Filled 2018-04-17: qty 80

## 2018-04-17 MED ORDER — SODIUM CHLORIDE 0.9% IV SOLUTION
Freq: Once | INTRAVENOUS | Status: DC
  Filled 2018-04-17: qty 250

## 2018-04-17 MED ORDER — SODIUM CHLORIDE 0.9 % IV SOLN
1.0000 g | Freq: Once | INTRAVENOUS | Status: AC
  Administered 2018-04-18: 1 g via INTRAVENOUS
  Filled 2018-04-17: qty 10

## 2018-04-17 MED ORDER — SODIUM CHLORIDE 0.9 % IV SOLN
80.0000 mg | Freq: Once | INTRAVENOUS | Status: AC
  Administered 2018-04-18: 80 mg via INTRAVENOUS
  Filled 2018-04-17: qty 80

## 2018-04-17 MED ORDER — OCTREOTIDE LOAD VIA INFUSION
50.0000 ug | Freq: Once | INTRAVENOUS | Status: AC
  Administered 2018-04-18: 50 ug via INTRAVENOUS
  Filled 2018-04-17: qty 25

## 2018-04-17 NOTE — ED Provider Notes (Signed)
[facility] Emergency Department Provider Note  ____________________________________________   First MD Initiated Contact with Patient 04/17/18 2350     (approximate)  I have reviewed the triage vital signs and the nursing notes.   HISTORY  Chief Complaint GI Bleeding   HPI Christian Fuentes is a 52 y.o. male who comes to the emergency department by EMS after having a massive upper GI bleed.  He had a syncopal event in route and had an initial blood pressure of 60.  The patient has a complex past medical history including factor V Leiden, Budd-Chiari syndrome, nonalcoholic cirrhosis, and is on Eliquis.  He has no history of GI bleeding.  He said earlier today he had moderate severity cramping abdominal pain followed by several large bowel movements followed by vomiting a large amount of dark red blood.  He is currently mildly nauseated but not in any pain.  His symptoms came on suddenly they were severe they seem to pass with time.  Nothing seemed to make them better or worse.    Past Medical History:  Diagnosis Date  . Cirrhosis ([place])   . Factor 5 Leiden mutation, heterozygous ([place])   . Pulmonary embolism [facility])     Patient Active Problem List   Diagnosis Date Noted  . Acute upper GI bleeding 04/18/2018  . Cirrhosis of liver ([place]) 01/09/2018  . Chronic cough 09/30/2017  . Inguinal hernia 11/18/2016  . Rectal bleeding 11/18/2016  . Budd-Chiari syndrome ([place]) 04/11/2015  . Depression 04/11/2015  . Hyperlipemia 05/29/2014  . Erectile dysfunction 03/24/2014  . Current non-adherence to medical treatment 08/09/2013  . Health care maintenance 01/31/2013  . 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 Hypertension, benign 11/27/2008  . Embolism and thrombosis ([place]) 11/24/2008  . Diastolic dysfunction 03/55/9741  . Sarcoidosis 07/06/2001    Past Surgical History:  Procedure Laterality Date  . CAROTID STENT      Prior to Admission  medications   Medication Sig Start Date End Date Taking? Authorizing Provider  apixaban (ELIQUIS) 5 MG TABS tablet Take 5 mg by mouth 2 (two) times daily. 03/21/16  Yes [provider]  furosemide (LASIX) 40 MG tablet Take 1 tablet (40 mg total) by mouth daily. 02/07/18  Yes Lucilla Lame, MD  nadolol (CORGARD) 40 MG tablet Take 1 tablet (40 mg total) by mouth daily. 01/31/18  Yes Lucilla Lame, MD  potassium chloride (KLOR-CON) 20 MEQ packet Take 20 mEq by mouth 2 (two) times daily.   Yes [provider]  spironolactone (ALDACTONE) 100 MG tablet Take 1 tablet (100 mg total) by mouth daily. 02/07/18  Yes Lucilla Lame, MD  amLODipine (NORVASC) 5 MG tablet Take 1 tablet (5 mg total) by mouth daily. Patient not taking: Reported on 01/31/2018 01/10/18   Hillary Bow, MD  potassium chloride 20 MEQ TBCR Take 20 mEq by mouth daily. Patient not taking: Reported on 04/18/2018 01/10/18   Hillary Bow, MD    Allergies Diphenhydramine hcl  History reviewed. No pertinent family history.  Social History Social History   Tobacco Use  . Smoking status: Never Smoker  . Smokeless tobacco: Never Used  Substance Use Topics  . Alcohol use: No  . Drug use: No    Review of Systems Constitutional: No fever/chills Eyes: No visual changes. ENT: No sore throat. Cardiovascular: Denies chest pain. Respiratory: Denies shortness of breath. Gastrointestinal: Positive for abdominal pain.  Positive for nausea, positive for vomiting.  Positive for diarrhea.  No constipation. Genitourinary:  Negative for dysuria. Musculoskeletal: Negative for back pain. Skin: Negative for rash. Neurological: Negative for headaches, focal weakness or numbness.   ____________________________________________   PHYSICAL EXAM:  VITAL SIGNS: ED Triage Vitals  Enc Vitals Group     BP      Pulse      Resp      Temp      Temp src      SpO2      Weight      Height      Head Circumference      Peak Flow       Pain Score      Pain Loc      Pain Edu?      Excl. in [place]?     Constitutional: Appears critically ill.  Speaking in slow methodical sentences Eyes: PERRL EOMI. Head: Atraumatic. Nose: No congestion/rhinnorhea. Mouth/Throat: No trismus Neck: No stridor.   Cardiovascular: Tachycardic rate, regular rhythm. Grossly normal heart sounds.  Good peripheral circulation. Respiratory: Increased respiratory effort.  No retractions. Lungs CTAB and moving good air Gastrointestinal: Soft nontender Musculoskeletal: No lower extremity edema   Neurologic:  . No gross focal neurologic deficits are appreciated. Skin: Diaphoretic Psychiatric somewhat confused   ____________________________________________   DIFFERENTIAL includes but not limited to  Esophageal varices, upper GI bleed, lower GI bleed, coagulopathy, hemorrhagic shock, vasovagal ____________________________________________   LABS (all labs ordered are listed, but only abnormal results are displayed)  Labs Reviewed  COMPREHENSIVE METABOLIC PANEL - Abnormal; Notable for the following components:      Result Value   CO2 20 (*)    Glucose, Bld 144 (*)    BUN 51 (*)    Creatinine, Ser 2.95 (*)    Calcium 8.8 (*)    Total Protein 5.7 (*)    Albumin 2.6 (*)    AST 42 (*)    Alkaline Phosphatase 143 (*)    Total Bilirubin 3.0 (*)    GFR calc non Af Amer 23 (*)    GFR calc Af Amer 26 (*)    All other components within normal limits  CBC WITH DIFFERENTIAL/PLATELET - Abnormal; Notable for the following components:   WBC 18.4 (*)    RBC 2.82 (*)    Hemoglobin 9.2 (*)    HCT 27.3 (*)    RDW 17.3 (*)    Neutro Abs 14.4 (*)    All other components within normal limits  PROTIME-INR - Abnormal; Notable for the following components:   Prothrombin Time 23.6 (*)    All other components within normal limits  HEMOGLOBIN AND HEMATOCRIT, BLOOD - Abnormal; Notable for the following components:   Hemoglobin 11.0 (*)    HCT 32.2 (*)    All  other components within normal limits  ETHANOL  URINALYSIS, COMPLETE (UACMP) WITH MICROSCOPIC  APTT  PROTIME-INR  PROTIME-INR  PROTIME-INR  PROTIME-INR  DIC (DISSEMINATED INTRAVASCULAR COAGULATION) PANEL  CBC  TYPE AND SCREEN  PREPARE RBC (CROSSMATCH)  PREPARE FRESH FROZEN PLASMA  ABO/RH  MASSIVE TRANSFUSION PROTOCOL ORDER (BLOOD BANK NOTIFICATION)    Lab work reviewed by me with drop in hemoglobin consistent with acute GI bleed __________________________________________  EKG  ED ECG REPORT I, Darel Hong, the attending physician, personally viewed and interpreted this ECG.  Date: 04/18/2018 EKG Time:  Rate: 106 Rhythm: Sinus tachycardia QRS Axis: normal Intervals: normal ST/T Wave abnormalities: normal Narrative Interpretation: no evidence of acute ischemia  ____________________________________________  RADIOLOGY  Initial chest x-ray reviewed by me with no  acute disease Subsequent chest x-ray reviewed by me with ET tube that is in the right mainstem ____________________________________________   PROCEDURES  Procedure(s) performed: Yes  .Critical Care Performed by: Darel Hong, MD Authorized by: Darel Hong, MD   Critical care provider statement:    Critical care time (minutes):  110   Critical care time was exclusive of:  Separately billable procedures and treating other patients   Critical care was necessary to treat or prevent imminent or life-threatening deterioration of the following conditions:  Shock   Critical care was time spent personally by me on the following activities:  Development of treatment plan with patient or surrogate, discussions with consultants, evaluation of patient's response to treatment, examination of patient, obtaining history from patient or surrogate, ordering and performing treatments and interventions, ordering and review of laboratory studies, ordering and review of radiographic studies, pulse oximetry, re-evaluation  of patient's condition and review of old charts Procedure Name: Intubation Date/Time: 04/18/2018 1:19 AM Performed by: Darel Hong, MD Pre-anesthesia Checklist: Patient identified, Emergency Drugs available, Suction available and Patient being monitored Oxygen Delivery Method: Ambu bag Preoxygenation: Pre-oxygenation with 100% oxygen Induction Type: IV induction and Rapid sequence Laryngoscope Size: 4 and Mac Grade View: Grade I Number of attempts: 1 Placement Confirmation: ETT inserted through vocal cords under direct vision,  CO2 detector and Breath sounds checked- equal and bilateral Secured at: 23 cm Tube secured with: ETT holder Dental Injury: Teeth and Oropharynx as per pre-operative assessment     .Central Line Date/Time: 04/18/2018 2:23 AM Performed by: Darel Hong, MD Authorized by: Darel Hong, MD   Consent:    Consent obtained:  Emergent situation Pre-procedure details:    Hand hygiene: Hand hygiene performed prior to insertion     Sterile barrier technique: All elements of maximal sterile technique followed     Skin preparation:  2% chlorhexidine   Skin preparation agent: Skin preparation agent completely dried prior to procedure   Sedation:    Sedation type:  Deep Anesthesia (see MAR for exact dosages):    Anesthesia method:  Local infiltration   Local anesthetic:  Lidocaine 1% w/o epi Procedure details:    Location:  R femoral   Patient position:  Reverse Trendelenburg   Procedural supplies:  Triple lumen   Landmarks identified: yes     Ultrasound guidance: yes     Sterile ultrasound techniques: Sterile gel and sterile probe covers were used     Number of attempts:  1   Successful placement: yes   Post-procedure details:    Post-procedure:  Dressing applied and line sutured   Assessment:  Blood return through all ports and free fluid flow   Patient tolerance of procedure:  Tolerated well, no immediate complications    Critical Care performed:  yes  ____________________________________________   INITIAL IMPRESSION / ASSESSMENT AND PLAN / ED COURSE  Pertinent labs & imaging results that were available during my care of the patient were reviewed by me and considered in my medical decision making (see chart for details).   The patient arrives critically ill with a blood pressure of 60 diaphoretic and had a prehospital syncopal event after a massive upper GI bleed with a known history of nonalcoholic cirrhosis.  First blood pressure here is in the 80s and he is able to talk.  Defer intubation emergently unless he vomits again.  2 units of uncrossed matched O+ blood for hemorrhagic shock.  Ceftriaxone, Protonix, and octreotide for upper GI bleed as well as tranexamic  acid and Kcentra for emergent reversal of the patient's Eliquis.  My partner Dr. Cinda Quest spoke with on-call gastroenterology Dr. Bonna Gains who recommended continued resuscitation and she will kindly consult on the case.     ----------------------------------------- 1:20 AM on 04/18/2018 -----------------------------------------  The patient vomited once again and his blood pressure went down to the 80s.  Decision was made to intubate to protect his airway.  Of activated massive transfusion protocol.  At this point given his complex history including Eliquis for Budd-Chiari syndrome and ongoing upper GI bleed I have a call out to [facility] to discuss transfer for higher level of care.  We are continuing aggressive blood product resuscitation at this point however the patient remains critically ill.  I am reaching out to GI once again for emergent endoscopy. ____________________________________________   ----------------------------------------- 1:49 AM on 04/18/2018 -----------------------------------------  Dr. Bonna Gains is on her way in for endoscopy however she does recommend transfer to a tertiary center.  I have spoken with [facility] patient logistics who has paged the ICU doctor  but I have yet to receive a call back.  I have a call out to [facility] to discuss transfer now.    ----------------------------------------- 1:59 AM on 04/18/2018 -----------------------------------------  I received a call from Dr. Radene Knee medical intensivist at [facility] who has graciously agreed to accept transfer and [facility] has capacity.   ----------------------------------------- 3:08 AM on 04/18/2018 -----------------------------------------  [facility] will be here in 30 minutes.  We will send the patient to endoscopy now to try to temporize the bleeding as his blood pressure is now down into the 80s.  Regardless he will require transfer to [facility].  FINAL CLINICAL IMPRESSION(S) / ED DIAGNOSES  Final diagnoses:  Upper GI bleed  Hemorrhagic shock (HCC)  Acute kidney injury ([place])      NEW MEDICATIONS STARTED DURING THIS VISIT:  New Prescriptions   No medications on file     Note:  This document was prepared using Dragon voice recognition software and may include unintentional dictation errors.     Darel Hong, MD 04/18/18 [phone]

## 2018-04-17 NOTE — ED Triage Notes (Signed)
Pt in via [facility] from home with acute onset N/V/D, vomiting bright red blood, approximately 1L output per EMS prior to arrival.  Pt with LOC with EMS as well.  Pt alert upon arrival but lethargic.  EDP to bedside.

## 2018-04-18 ENCOUNTER — Encounter: Payer: Self-pay | Admitting: Anesthesiology

## 2018-04-18 ENCOUNTER — Encounter
Admission: EM | Disposition: A | Discharge status: Other Acute Inpatient Hospital | Payer: Self-pay | Source: Home / Self Care | Attending: Internal Medicine

## 2018-04-18 ENCOUNTER — Encounter: Payer: Self-pay | Admitting: Emergency Medicine

## 2018-04-18 ENCOUNTER — Emergency Department: Payer: BLUE CROSS/BLUE SHIELD

## 2018-04-18 ENCOUNTER — Inpatient Hospital Stay: Payer: BLUE CROSS/BLUE SHIELD

## 2018-04-18 DIAGNOSIS — N183 Chronic kidney disease, stage 3 (moderate): Secondary | ICD-10-CM | POA: Diagnosis present

## 2018-04-18 DIAGNOSIS — K922 Gastrointestinal hemorrhage, unspecified: Secondary | ICD-10-CM

## 2018-04-18 DIAGNOSIS — Z7901 Long term (current) use of anticoagulants: Secondary | ICD-10-CM | POA: Diagnosis not present

## 2018-04-18 DIAGNOSIS — K7469 Other cirrhosis of liver: Secondary | ICD-10-CM

## 2018-04-18 DIAGNOSIS — R578 Other shock: Secondary | ICD-10-CM | POA: Diagnosis present

## 2018-04-18 DIAGNOSIS — K920 Hematemesis: Secondary | ICD-10-CM | POA: Diagnosis present

## 2018-04-18 DIAGNOSIS — Z86718 Personal history of other venous thrombosis and embolism: Secondary | ICD-10-CM | POA: Diagnosis not present

## 2018-04-18 DIAGNOSIS — R739 Hyperglycemia, unspecified: Secondary | ICD-10-CM | POA: Diagnosis present

## 2018-04-18 DIAGNOSIS — I8500 Esophageal varices without bleeding: Secondary | ICD-10-CM | POA: Diagnosis present

## 2018-04-18 DIAGNOSIS — D6851 Activated protein C resistance: Secondary | ICD-10-CM | POA: Diagnosis present

## 2018-04-18 DIAGNOSIS — K7460 Unspecified cirrhosis of liver: Secondary | ICD-10-CM | POA: Diagnosis present

## 2018-04-18 DIAGNOSIS — Z86711 Personal history of pulmonary embolism: Secondary | ICD-10-CM | POA: Diagnosis not present

## 2018-04-18 DIAGNOSIS — N179 Acute kidney failure, unspecified: Secondary | ICD-10-CM | POA: Diagnosis present

## 2018-04-18 LAB — FIBRIN DERIVATIVES D-DIMER (ARMC ONLY): Fibrin derivatives D-dimer (ARMC): 1003.07 ng/mL (FEU) — ABNORMAL HIGH (ref 0.00–499.00)

## 2018-04-18 LAB — COMPREHENSIVE METABOLIC PANEL
ALBUMIN: 2.6 g/dL — AB (ref 3.5–5.0)
ALK PHOS: 143 U/L — AB (ref 38–126)
ALT: 24 U/L (ref 0–44)
ANION GAP: 11 (ref 5–15)
AST: 42 U/L — ABNORMAL HIGH (ref 15–41)
BUN: 51 mg/dL — ABNORMAL HIGH (ref 6–20)
CALCIUM: 8.8 mg/dL — AB (ref 8.9–10.3)
CO2: 20 mmol/L — AB (ref 22–32)
Chloride: 111 mmol/L (ref 98–111)
Creatinine, Ser: 2.95 mg/dL — ABNORMAL HIGH (ref 0.61–1.24)
GFR calc non Af Amer: 23 mL/min — ABNORMAL LOW (ref >60)
GFR, EST AFRICAN AMERICAN: 26 mL/min — AB (ref >60)
Glucose, Bld: 144 mg/dL — ABNORMAL HIGH (ref 70–99)
Potassium: 4 mmol/L (ref 3.5–5.1)
SODIUM: 142 mmol/L (ref 135–145)
Total Bilirubin: 3 mg/dL — ABNORMAL HIGH (ref 0.3–1.2)
Total Protein: 5.7 g/dL — ABNORMAL LOW (ref 6.5–8.1)

## 2018-04-18 LAB — CBC WITH DIFFERENTIAL/PLATELET
BASOS PCT: 1 %
Basophils Absolute: 0.1 10*3/uL (ref 0–0.1)
EOS ABS: 0.2 10*3/uL (ref 0–0.7)
Eosinophils Relative: 1 %
HEMATOCRIT: 27.3 % — AB (ref 40.0–52.0)
HEMOGLOBIN: 9.2 g/dL — AB (ref 13.0–18.0)
LYMPHS ABS: 2.8 10*3/uL (ref 1.0–3.6)
Lymphocytes Relative: 15 %
MCH: 32.7 pg (ref 26.0–34.0)
MCHC: 33.7 g/dL (ref 32.0–36.0)
MCV: 96.9 fL (ref 80.0–100.0)
Monocytes Absolute: 0.9 10*3/uL (ref 0.2–1.0)
Monocytes Relative: 5 %
NEUTROS ABS: 14.4 10*3/uL — AB (ref 1.4–6.5)
NEUTROS PCT: 78 %
Platelets: 190 10*3/uL (ref 150–440)
RBC: 2.82 MIL/uL — AB (ref 4.40–5.90)
RDW: 17.3 % — ABNORMAL HIGH (ref 11.5–14.5)
WBC: 18.4 10*3/uL — AB (ref 3.8–10.6)

## 2018-04-18 LAB — ABO/RH: ABO/RH(D): A POS

## 2018-04-18 LAB — URINALYSIS, COMPLETE (UACMP) WITH MICROSCOPIC
Bilirubin Urine: NEGATIVE
GLUCOSE, UA: NEGATIVE mg/dL
HGB URINE DIPSTICK: NEGATIVE
Ketones, ur: NEGATIVE mg/dL
LEUKOCYTES UA: NEGATIVE
NITRITE: NEGATIVE
PH: 6 (ref 5.0–8.0)
Protein, ur: NEGATIVE mg/dL
SPECIFIC GRAVITY, URINE: 1.014 (ref 1.005–1.030)

## 2018-04-18 LAB — APTT: APTT: 36 s (ref 24–36)

## 2018-04-18 LAB — PROTIME-INR
INR: 2.12
INR: 2.15
PROTHROMBIN TIME: 23.6 s — AB (ref 11.4–15.2)
Prothrombin Time: 23.8 seconds — ABNORMAL HIGH (ref 11.4–15.2)

## 2018-04-18 LAB — FIBRINOGEN: Fibrinogen: 250 mg/dL (ref 210–475)

## 2018-04-18 LAB — ETHANOL: Alcohol, Ethyl (B): <10 mg/dL (ref <10)

## 2018-04-18 LAB — PREPARE RBC (CROSSMATCH)

## 2018-04-18 LAB — HEMOGLOBIN AND HEMATOCRIT, BLOOD
HEMATOCRIT: 32.2 % — AB (ref 40.0–52.0)
Hemoglobin: 11 g/dL — ABNORMAL LOW (ref 13.0–18.0)

## 2018-04-18 LAB — PLATELET COUNT: Platelets: 154 10*3/uL (ref 150–440)

## 2018-04-18 LAB — MASSIVE TRANSFUSION PROTOCOL ORDER (BLOOD BANK NOTIFICATION)

## 2018-04-18 SURGERY — EGD (ESOPHAGOGASTRODUODENOSCOPY)
Anesthesia: Choice

## 2018-04-18 MED ORDER — KETAMINE HCL 10 MG/ML IJ SOLN
100.0000 mg | Freq: Once | INTRAMUSCULAR | Status: AC
  Administered 2018-04-18: 100 mg via INTRAVENOUS

## 2018-04-18 MED ORDER — DEXTROSE 10 % IV SOLN
12.50 | INTRAVENOUS | Status: DC
Start: ? — End: 2018-04-18

## 2018-04-18 MED ORDER — PANTOPRAZOLE SODIUM 40 MG IV SOLR
40.00 | INTRAVENOUS | Status: DC
Start: 2018-04-25 — End: 2018-04-18

## 2018-04-18 MED ORDER — FENTANYL CITRATE (PF) 100 MCG/2ML IJ SOLN: 50.0000 ug | Freq: Once | INTRAMUSCULAR | Status: DC

## 2018-04-18 MED ORDER — SODIUM BICARBONATE 8.4 % IV SOLN
50.00 | INTRAVENOUS | Status: DC
Start: 2018-04-18 — End: 2018-04-18

## 2018-04-18 MED ORDER — NOREPINEPHRINE 4 MG/250ML-% IV SOLN
0.0000 ug/min | Freq: Once | INTRAVENOUS | Status: AC
  Administered 2018-04-18: 2 ug/min via INTRAVENOUS
  Filled 2018-04-18: qty 250

## 2018-04-18 MED ORDER — GENERIC EXTERNAL MEDICATION
1.00 | Status: DC
Start: 2018-04-19 — End: 2018-04-18

## 2018-04-18 MED ORDER — METOCLOPRAMIDE HCL 5 MG/ML IJ SOLN
10.00 | INTRAMUSCULAR | Status: DC
Start: 2018-04-18 — End: 2018-04-18

## 2018-04-18 MED ORDER — INSULIN LISPRO 100 UNIT/ML ~~LOC~~ SOLN
0.00 | SUBCUTANEOUS | Status: DC
Start: 2018-04-21 — End: 2018-04-18

## 2018-04-18 MED ORDER — PROTHROMBIN COMPLEX CONC HUMAN 500 UNITS IV KIT
50.0000 [IU]/kg | PACK | Status: DC
  Filled 2018-04-18: qty 3400

## 2018-04-18 MED ORDER — SODIUM CHLORIDE 0.9 % IV SOLN: 3.0000 mg/kg | Freq: Once | INTRAVENOUS | Status: DC

## 2018-04-18 MED ORDER — PROTHROMBIN COMPLEX CONC HUMAN 500 UNITS IV KIT
3500.0000 [IU] | PACK | Status: AC
  Administered 2018-04-18: 3500 [IU] via INTRAVENOUS
  Filled 2018-04-18: qty 3500

## 2018-04-18 MED ORDER — SUCCINYLCHOLINE CHLORIDE 20 MG/ML IJ SOLN
150.0000 mg | Freq: Once | INTRAMUSCULAR | Status: AC
  Administered 2018-04-18: 150 mg via INTRAVENOUS

## 2018-04-18 MED ORDER — METOCLOPRAMIDE HCL 5 MG/ML IJ SOLN
10.00 | INTRAMUSCULAR | Status: DC
Start: ? — End: 2018-04-18

## 2018-04-18 MED ORDER — FENTANYL 2500MCG IN NS 250ML (10MCG/ML) PREMIX INFUSION
100.0000 ug/h | INTRAVENOUS | Status: DC
  Administered 2018-04-18: 100 ug/h via INTRAVENOUS
  Filled 2018-04-18: qty 250

## 2018-04-18 MED ORDER — GENERIC EXTERNAL MEDICATION
50.00 | Status: DC
Start: ? — End: 2018-04-18

## 2018-04-18 MED ORDER — CHLORHEXIDINE GLUCONATE 0.12 % MT SOLN
5.00 | OROMUCOSAL | Status: DC
Start: 2018-04-19 — End: 2018-04-18

## 2018-04-18 MED ORDER — HYDROMORPHONE HCL 1 MG/ML IJ SOLN
INTRAMUSCULAR | Status: AC
  Filled 2018-04-18: qty 2

## 2018-04-18 MED ORDER — INSULIN REGULAR HUMAN 100 UNIT/ML IJ SOLN
5.00 | INTRAMUSCULAR | Status: DC
Start: 2018-04-18 — End: 2018-04-18

## 2018-04-18 MED ORDER — METOCLOPRAMIDE HCL 5 MG/ML IJ SOLN
10.0000 mg | Freq: Once | INTRAMUSCULAR | Status: AC
Start: 2018-04-18 — End: 2018-04-18
  Administered 2018-04-18: 10 mg via INTRAVENOUS
  Filled 2018-04-18: qty 2

## 2018-04-18 MED ORDER — PROPOFOL 100 MG/10ML IV EMUL
0.00 | INTRAVENOUS | Status: DC
Start: ? — End: 2018-04-18

## 2018-04-18 MED ORDER — FENTANYL CITRATE (PF) 100 MCG/2ML IJ SOLN
150.0000 ug | Freq: Once | INTRAMUSCULAR | Status: AC
  Administered 2018-04-18: 150 ug via INTRAVENOUS

## 2018-04-18 MED ORDER — VITAMIN K1 10 MG/ML IJ SOLN
10.0000 mg | Freq: Once | INTRAVENOUS | Status: AC
  Administered 2018-04-18: 10 mg via INTRAVENOUS
  Filled 2018-04-18 (×2): qty 1

## 2018-04-18 MED ORDER — GENERIC EXTERNAL MEDICATION
1.00 | Status: DC
Start: ? — End: 2018-04-18

## 2018-04-18 MED ORDER — CHLORHEXIDINE GLUCONATE 0.12 % MT SOLN
10.00 | OROMUCOSAL | Status: DC
Start: 2018-04-18 — End: 2018-04-18

## 2018-04-18 MED ORDER — HYDROMORPHONE HCL 1 MG/ML IJ SOLN
2.0000 mg | Freq: Once | INTRAMUSCULAR | Status: AC
  Administered 2018-04-18: 2 mg via INTRAVENOUS

## 2018-04-18 MED ORDER — GENERIC EXTERNAL MEDICATION
2.00 | Status: DC
Start: 2018-04-23 — End: 2018-04-18

## 2018-04-18 MED ORDER — FENTANYL CITRATE 2500 MCG/50ML IV SOLN
.00 | INTRAVENOUS | Status: DC
Start: ? — End: 2018-04-18

## 2018-04-18 MED ORDER — DEXTROSE 10 % IV SOLN
25.00 | INTRAVENOUS | Status: DC
Start: 2018-04-18 — End: 2018-04-18

## 2018-04-18 MED ORDER — PROPOFOL 1000 MG/100ML IV EMUL
5.0000 ug/kg/min | Freq: Once | INTRAVENOUS | Status: AC
  Administered 2018-04-18: 5 ug/kg/min via INTRAVENOUS
  Filled 2018-04-18: qty 100

## 2018-04-18 MED ORDER — PROPOFOL 10 MG/ML IV BOLUS
INTRAVENOUS | Status: AC
  Filled 2018-04-18: qty 20

## 2018-04-18 MED ORDER — FENTANYL CITRATE (PF) 100 MCG/2ML IJ SOLN
INTRAMUSCULAR | Status: AC
  Filled 2018-04-18: qty 2

## 2018-04-18 MED ORDER — SODIUM CHLORIDE 0.9 % IV SOLN: 10.0000 mL/h | Freq: Once | INTRAVENOUS | Status: DC

## 2018-04-18 MED ORDER — SODIUM CHLORIDE 0.9 % IV SOLN
INTRAVENOUS | Status: DC
Start: ? — End: 2018-04-18

## 2018-04-18 NOTE — Progress Notes (Addendum)
At this point, anesthesia/OR staff is available to staff the case, but Dr. Lamont Snowballifenbark would like to proceed with [facility] as the helicopter is expected to be here shortly.   Addendum: At 3:36 AM, the helicopter staff has taken the patient to the helicopter to [facility]  I have been in the hospital available and ready to proceed with the EGD as noted in my consult note, but now pt is [facility] way [facility].

## 2018-04-18 NOTE — ED Notes (Signed)
Pt's clothing cut off at time of arrival; verbal ok given via patient prior to intubation to discard clothing.  Bag of prescription medications placed in pt belonging bag and to be sent with patient at time of transfer.

## 2018-04-18 NOTE — ED Notes (Signed)
Multiple calls made to Emergency contact due to family not found in waiting room. Mother, Eldred MangesChristie Wright [phone] (no answer after multiple calls)

## 2018-04-18 NOTE — Progress Notes (Signed)
Chaplain responded to page for "Emergency traffic". Family was in the family room. Son, Daughter and mother. Young children was with them. Chaplain asked that the young children not be taken bac. Chaplain escorted family to the room and offer ministry of hospitality. Sister came back and nurses asked that some of the family switch periodically. Chaplain offered and delivered  Prayers. Chaplain maintained presence while Dr. Jovita GammaGave diagnostis. Chaplain asked if there was any other needs fro the family and Dr. None was needed.    04/18/18 0000  Clinical Encounter Type  Visited With Patient and family together  Visit Type Initial;Spiritual support  Referral From Nurse  Spiritual Encounters  Spiritual Needs Prayer

## 2018-04-18 NOTE — ED Notes (Signed)
EDP to bedside to place [employer].

## 2018-04-18 NOTE — ED Notes (Signed)
Pt nauseas, vomiting blood.  EDP to bedside at this time.

## 2018-04-18 NOTE — Consult Note (Addendum)
Christian Bouillon, MD [address], [address], [place], [place], [address] [address], [place], [place], [address] Phone: [phone]  Fax: [phone]  Consultation  Referring Provider:     Dr. Lamont Snowball Primary Care Physician:  Clinic, General Medical Reason for Consultation:     Hematemesis  Date of Admission:  04/17/2018 Date of Consultation:  04/18/2018         HPI:   Christian [facility] is a 52 y.o. 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  Patient intubated and sedated unable to provide history.  No family available in the hospital.  Patient previously followed at [facility], and has not seen them in a long time.  Recently admitted with new onset ascites as well, with no recurrence and ascites after his paracentesis, and taking his diuretics.    His previous CT in March 2019, showed bulky esophageal varices, and abdominal varices as well.   Past Medical History:  Diagnosis Date  . Cirrhosis (HCC)   . Factor 5 Leiden mutation, heterozygous (HCC)   . Pulmonary embolism [facility])     Past Surgical History:  Procedure Laterality Date  . CAROTID STENT      Prior to Admission medications   Medication Sig Start Date End Date Taking? Authorizing Provider  apixaban (ELIQUIS) 5 MG TABS tablet Take 5 mg by mouth 2 (two) times daily. 03/21/16  Yes [provider]  furosemide (LASIX) 40 MG tablet Take 1 tablet (40 mg total) by mouth daily. 02/07/18  Yes Midge Minium, MD  nadolol (CORGARD) 40 MG tablet Take 1 tablet (40 mg total) by mouth daily. 01/31/18  Yes Midge Minium, MD  potassium chloride (KLOR-CON) 20 MEQ packet Take 20 mEq by mouth 2 (two) times daily.   Yes [provider]  spironolactone (ALDACTONE) 100 MG tablet Take 1 tablet (100 mg total) by mouth daily. 02/07/18  Yes Midge Minium, MD    amLODipine (NORVASC) 5 MG tablet Take 1 tablet (5 mg total) by mouth daily. Patient not taking: Reported on 01/31/2018 01/10/18   Milagros Loll, MD  potassium chloride 20 MEQ TBCR Take 20 mEq by mouth daily. Patient not taking: Reported on 04/18/2018 01/10/18   Milagros Loll, MD    History reviewed. No pertinent family history.   Social History   Tobacco Use  . Smoking status: Never Smoker  . Smokeless tobacco: Never Used  Substance Use Topics  . Alcohol use: No  . Drug use: No    Allergies as of 04/17/2018 - Review Complete 04/17/2018  Allergen Reaction Noted  . Diphenhydramine hcl Nausea And Vomiting 12/26/2016    Review of Systems:    All systems reviewed and negative except where noted in HPI.   Physical Exam:  Vital signs in last 24 hours: Vitals:   04/18/18 0200 04/18/18 0215 04/18/18 0220 04/18/18 0230  BP: (!) 175/94 (!) 155/78 (!) 160/110 (!) 160/89  Pulse: (!) 115 (!) 109 (!) 109 (!) 111  Resp: (!) 22 (!) 22 (!) 21 20  SpO2: 100% 100% 100% 100%  Weight:      Height:         General:  Intubated sedated Head:  Normocephalic and atraumatic. Eyes:   No icterus.   Conjunctiva pink. PERRLA. Neck:  Supple; no masses or thyroidomegaly, trachea midline Lungs: Respirations even and unlabored. Lungs clear to auscultation bilaterally.  No wheezes, crackles, or rhonchi.  Cardiac: +S1, +S2, RRR, No M/R/G, No edema Abdomen:  Soft, nondistended, nontender. Normal bowel sounds. No appreciable masses or hepatomegaly.  No rebound or guarding.  Skin:  Intact without significant lesions or rashes. Cervical Nodes:  No significant cervical adenopathy.   LAB RESULTS: Recent Labs    04/17/18 2359  WBC 18.4*  HGB 9.2*  HCT 27.3*  PLT 190   BMET Recent Labs    04/17/18 2359  NA 142  K 4.0  CL 111  CO2 20*  GLUCOSE 144*  BUN 51*  CREATININE 2.95*  CALCIUM 8.8*   LFT Recent Labs    04/17/18 2359  PROT 5.7*  ALBUMIN 2.6*  AST 42*  ALT 24  ALKPHOS 143*   BILITOT 3.0*   PT/INR Recent Labs    04/17/18 2359  LABPROT 23.6*  INR 2.12    STUDIES: Dg Chest Portable 1 View  Result Date: 04/18/2018 CLINICAL DATA:  Post intubation.  OG tube placement. EXAM: PORTABLE CHEST 1 VIEW COMPARISON:  None. FINDINGS: Endotracheal tube has been placed with tip at the carina near the origin of the right mainstem bronchus. Retraction of about 2.5 cm is suggested. Enteric tube tip is off the field of view but is below the left hemidiaphragm. Shallow inspiration with linear atelectasis in the right lung base. Heart size and pulmonary vascularity are normal. No focal consolidation in the lungs. No blunting of costophrenic angles. No pneumothorax. Abdominal aortic stent graft is present. IMPRESSION: Endotracheal tube tip is at the carina near the origin of the right mainstem bronchus. Retraction of about 2.5 cm is recommended. Linear atelectasis in the right lung base. Electronically Signed   By: Burman Nieves M.D.   On: 04/18/2018 01:42   Dg Chest Port 1 View  Result Date: 04/18/2018 CLINICAL DATA:  Acute onset nausea, vomiting, and diarrhea. Vomiting bright red blood. EXAM: PORTABLE CHEST 1 VIEW COMPARISON:  11/05/2015 FINDINGS: The heart size and mediastinal contours are within normal limits. Both lungs are clear. The visualized skeletal structures are unremarkable. IMPRESSION: No active disease. Electronically Signed   By: Burman Nieves M.D.   On: 04/18/2018 00:35   CT March 2019 IMPRESSION: 1. Cirrhotic, heterogeneously enhancing liver with moderate to large volume of ascites. Associated large right scrotal hydrocele. Mild splenomegaly. 2. Bulky esophageal varices.  Other small caliber abdominal varices. 3. 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 4. Trace pleural effusions.  Mild right lung base atelectasis. 5. Bilateral renal cortical atrophy and scarring.    Impression / Plan:   [facility] is a 52 y.o. 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, factor V Leyden deficiency, on chronic Eliquis, previously followed at [facility], and has not followed up with them in a long time, presents with hematemesis  Due to repeated episodes of hematemesis, patient was intubated Protonix drip and octreotide drip have been started Theodoro Parma is also been given Due to ongoing bleeding, decision was made to to proceed with an EGD tonight to evaluate source of bleeding Possibly variceal versus peptic ulcer disease  Esophageal varices can be treated with banding However, if he is bleeding from gastric varices that were seen on CT, TIPS would be the best next step He has been accepted for [facility] transfer as well even if he stabilizes, and bleeding is controlled, since he will likely be at high risk of rebleeding after the procedure  as well, I do [facility], after evaluation of bleeding source of therapeutic EGD here tonight, since he already has a bed available, as TIPS cannot be done here as [facility], and it is important for him to be at a facility where he can have TIPS performed if one is needed.   Dr. Lamont Snowballifenbark, from the ED, also knows how to place of [place] tube, and is willing to assist if that is needed. Continue PPI IV twice daily Continue octreotide drip Continue serial CBCs and transfuse PRN Maintain 2 large-bore IV lines  Anesthesia staff was informed as soon as the ED notified me of the patient, and the need for the EGD.  The OR/and their anesthesia staff is needed tonight for on-call purposes, to proceed with EGD.  It is now well over an hour after anesthesia was informed about the need for an emergency EGD on this patient.  They state, they are awaiting for a labor and delivery case to complete.  When they were initially informed about needing the emergency EGD, it was specifically noted that it is an emergency case,  and that a second team will be needed if they have other cases going.  At this point, we are delayed because of OR/anesthesia staff availability.   Thank you for involving me in the care of this patient.      LOS: 0 days   Pasty SpillersVarnita B Davine Sweney, MD  04/18/2018, 2:39 AM

## 2018-04-18 NOTE — H&P (Signed)
[employer] [facility] [place] [facility]   PATIENT NAME: [facility]    MR#:  [id number]  DATE OF BIRTH:  May 11, 1966  DATE OF ADMISSION:  04/17/2018  PRIMARY CARE PHYSICIAN: Clinic, General Medical   REQUESTING/REFERRING PHYSICIAN: Merrily Brittle, MD  CHIEF COMPLAINT:   Chief Complaint  Patient presents with  . GI Bleeding    HISTORY OF PRESENT ILLNESS:  Christian Fu[facility]  is a 52 y.o. 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 EMS called 2/2 large volume hematemesis, reported 1L by EMS. Syncope x1. SBP 60 by EMS. SBP in ED 80-90. Continued to vomit in ED, 500cc dark blood. Intubated. Additional 200cc in suction canister + 100cc in suction tubing at time of my assessment. Central line pending. Receiving pRBC, kcentra, FFP, Octreotide gtt, Protonix, sedation. S/p Ceftriaxone, Tranexamic acid. GI contacted, Dr. Maximino Greenland to evaluate. ED provider (Dr. Lamont Snowball) to transfer to [facility].  PAST MEDICAL HISTORY:   Past Medical History:  Diagnosis Date  . Cirrhosis (HCC)   . Factor 5 Leiden mutation, heterozygous (HCC)   . Pulmonary embolism (HCC)     PAST SURGICAL HISTORY:   Past Surgical History:  Procedure Laterality Date  . CAROTID STENT      SOCIAL HISTORY:   Social History   Tobacco Use  . Smoking status: Never Smoker  . Smokeless tobacco: Never Used  Substance Use Topics  . Alcohol use: No    FAMILY HISTORY:  History reviewed. No pertinent family history.  DRUG ALLERGIES:   Allergies  Allergen Reactions  . Diphenhydramine Hcl Nausea And Vomiting    REVIEW OF SYSTEMS:   Review of Systems  Unable to perform ROS: Intubated  Gastrointestinal: Positive for vomiting.  Neurological: Positive for loss of consciousness.   MEDICATIONS AT HOME:   Prior to Admission medications   Medication Sig Start Date End Date  Taking? Authorizing Provider  apixaban (ELIQUIS) 5 MG TABS tablet Take 5 mg by mouth 2 (two) times daily. 03/21/16  Yes [provider]  furosemide (LASIX) 40 MG tablet Take 1 tablet (40 mg total) by mouth daily. 02/07/18  Yes Midge Minium, MD  nadolol (CORGARD) 40 MG tablet Take 1 tablet (40 mg total) by mouth daily. 01/31/18  Yes Midge Minium, MD  potassium chloride (KLOR-CON) 20 MEQ packet Take 20 mEq by mouth 2 (two) times daily.   Yes [provider]  spironolactone (ALDACTONE) 100 MG tablet Take 1 tablet (100 mg total) by mouth daily. 02/07/18  Yes Midge Minium, MD  amLODipine (NORVASC) 5 MG tablet Take 1 tablet (5 mg total) by mouth daily. Patient not taking: Reported on 01/31/2018 01/10/18   Milagros Loll, MD  potassium chloride 20 MEQ TBCR Take 20 mEq by mouth daily. Patient not taking: Reported on 04/18/2018 01/10/18   Milagros Loll, MD      VITAL SIGNS:  Blood pressure (!) 168/103, pulse (!) 126, resp. rate 20, height 5\' 9"  (1.753 m), weight 68 kg (150 lb), SpO2 99 %.  PHYSICAL EXAMINATION:  Physical Exam  Constitutional: He appears well-developed and well-nourished.  Non-toxic appearance. He does not have a sickly appearance. He does not appear ill. No distress. He is intubated.  HENT:  Head: Normocephalic and atraumatic.  Eyes: Conjunctivae, EOM and lids are normal. No scleral icterus.  Neck: Neck supple. No JVD present. No thyromegaly present.  Cardiovascular: Regular rhythm, S1 normal,  S2 normal and normal heart sounds.  No extrasystoles are present. Tachycardia present. Exam reveals no gallop, no S3, no S4, no distant heart sounds and no friction rub.  No murmur heard. Pulmonary/Chest: Effort normal and breath sounds normal. No accessory muscle usage or stridor. No apnea, no tachypnea and no bradypnea. He is intubated. No respiratory distress. He has no decreased breath sounds. He has no wheezes. He has no rhonchi. He has no rales.  Abdominal: Soft. Bowel sounds  are normal. He exhibits no distension. There is no tenderness. There is no rebound and no guarding.  Musculoskeletal: Normal range of motion. He exhibits no edema.  Lymphadenopathy:    He has no cervical adenopathy.  Neurological:  Intubated/sedated.  Skin: Skin is warm, dry and intact. No rash noted. He is not diaphoretic. No erythema.  Psychiatric:  Intubated/sedated.   LABORATORY PANEL:   CBC Recent Labs  Lab 04/17/18 2359  WBC 18.4*  HGB 9.2*  HCT 27.3*  PLT 190   ------------------------------------------------------------------------------------------------------------------  Chemistries  Recent Labs  Lab 04/17/18 2359  NA 142  K 4.0  CL 111  CO2 20*  GLUCOSE 144*  BUN 51*  CREATININE 2.95*  CALCIUM 8.8*  AST 42*  ALT 24  ALKPHOS 143*  BILITOT 3.0*   ------------------------------------------------------------------------------------------------------------------  Cardiac Enzymes No results for input(s): TROPONINI in the last 168 hours. ------------------------------------------------------------------------------------------------------------------  RADIOLOGY:  Dg Chest Portable 1 View  Result Date: 04/18/2018 CLINICAL DATA:  Post intubation.  OG tube placement. EXAM: PORTABLE CHEST 1 VIEW COMPARISON:  None. FINDINGS: Endotracheal tube has been placed with tip at the carina near the origin of the right mainstem bronchus. Retraction of about 2.5 cm is suggested. Enteric tube tip is off the field of view but is below the left hemidiaphragm. Shallow inspiration with linear atelectasis in the right lung base. Heart size and pulmonary vascularity are normal. No focal consolidation in the lungs. No blunting of costophrenic angles. No pneumothorax. Abdominal aortic stent graft is present. IMPRESSION: Endotracheal tube tip is at the carina near the origin of the right mainstem bronchus. Retraction of about 2.5 cm is recommended. Linear atelectasis in the right lung  base. Electronically Signed   By: Burman Nieves M.D.   On: 04/18/2018 01:42   Dg Chest Port 1 View  Result Date: 04/18/2018 CLINICAL DATA:  Acute onset nausea, vomiting, and diarrhea. Vomiting bright red blood. EXAM: PORTABLE CHEST 1 VIEW COMPARISON:  11/05/2015 FINDINGS: The heart size and mediastinal contours are within normal limits. Both lungs are clear. The visualized skeletal structures are unremarkable. IMPRESSION: No active disease. Electronically Signed   By: Burman Nieves M.D.   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 Intubated. Actively bleeding. Also w/ hyperglycemia, renal failure (known CKD III w/ possible superimposed AKI), transaminasemia, hyperbilirubinemia, hypoproteinemia/hypoalbuminemia, leukocytosis. -Hgb 9.2 on admission, decreased from 12.2 (03/05/2018) -Continues to actively bleed -Intubated, NPO, NGT draining dark blood -Central line -Transfuse pRBC -Plasma, kcentra, FFP, Octreotide gtt, Protonix -s/p Ceftriaxone, Tranexamic acid -GI contacted, Dr. Maximino Greenland to evaluate -Pending Xfer to [facility] -NPO -SCDs, no pharmacological DVT PPx, hold Eliquis -Full code -Admission, > 2 midnights. Xfer to [facility] pending (accepted).   All the records are reviewed and case discussed with ED provider. Management plans discussed with the patient, family and they are in agreement.  CODE STATUS: Full code  TOTAL TIME TAKING CARE OF THIS PATIENT: 90 minutes.    Barbaraann RondoPrasanna Mehak Roskelley M.D on 04/18/2018 at 2:02 AM  Between 7am to 6pm - Pager - [phone]  After 6pm go to [email or web address] - Social research officer, governmentpassword [email or web address]  [facility] [place] [facility]  Office  [phone]  CC: Primary care physician; Clinic, General Medical   Note: This dictation was prepared with Dragon dictation along with smaller  phrase technology. Any transcriptional errors that result from this process are unintentional.

## 2018-04-18 NOTE — ED Notes (Signed)
EMTALA COMPLETED. CONSENT NOT SIGNED DUE TO NO FAMILY. VS WITHIN 30 MINUTES OF DEPARTURE.

## 2018-04-18 NOTE — Progress Notes (Signed)
MEDICATION RELATED CONSULT NOTE - INITIAL   Pharmacy Consult for Kcentra (4 factor-PCC) Indication: GI bleed s/t apixaban therapy  Allergies  Allergen Reactions  . Diphenhydramine Hcl Nausea And Vomiting    Patient Measurements: Height: 5\' 9"  (175.3 cm) Weight: 150 lb (68 kg) IBW/kg (Calculated) : 70.7 Adjusted Body Weight: 68 kg  Vital Signs: BP: 103/67 (07/04 0045) Pulse Rate: 110 (07/04 0015) Intake/Output from previous day: 07/03 0701 - 07/04 0700 In: 1100 [I.V.:1000; IV Piggyback:100] Out: -  Intake/Output from this shift: Total I/O In: 1100 [I.V.:1000; IV Piggyback:100] Out: -   Labs: Recent Labs    04/17/18 2359  WBC 18.4*  HGB 9.2*  HCT 27.3*  PLT 190  CREATININE 2.95*  ALBUMIN 2.6*  PROT 5.7*  AST 42*  ALT 24  ALKPHOS 143*  BILITOT 3.0*   Estimated Creatinine Clearance: 27.9 mL/min (A) (by C-G formula based on SCr of 2.95 mg/dL (H)).   Microbiology: No results found for this or any previous visit (from the past 720 hour(s)).  Medical History: Past Medical History:  Diagnosis Date  . Cirrhosis (HCC)   . Factor 5 Leiden mutation, heterozygous (HCC)   . Pulmonary embolism (HCC)     Medications:  Scheduled:  . sodium chloride   Intravenous Once  . fentaNYL (SUBLIMAZE) injection  50 mcg Intravenous Once  . ketamine (KETALAR) injection 10mg /mL (IV use)  100 mg Intravenous Once  . [START ON 04/21/2018] pantoprazole  40 mg Intravenous Q12H  . succinylcholine  150 mg Intravenous Once    Assessment: Patient admitted as emergency traffic, vomiting blood at least twice w/ h/o NASH cirrhosis. INR elevated at 2.12, AST 42/ALT 24, Tbili 3.0. Patient was ordered octreotide/protonix drip for GI bleed.  IV vitamin K for elevated INR s/t to hepatic steatosis. Patient is also being given Kcentra for apixaban reversal.  Goal of Therapy:  INR </= 1 (even though this is most likely pathology of cirrhosis) Normalization of hgb (admission 9.2; baseline 12 -  13) Normalization of vitals  Plan:  Will give Kcentra 3500 units IV x 1 (based on dose of 50 units/kg for anti-Xa reversal) Will monitor PT/INR 60 mins post infusion then: Every 6 hours for 4 hours, then daily x 2 Will continue to monitor CBC's and trend hgb, and monitor stabilization of bleeding and vitals.  Thomasene Rippleavid Torion Hulgan, PharmD, BCPS Clinical Pharmacist 04/18/2018

## 2018-04-18 NOTE — Anesthesia Preprocedure Evaluation (Deleted)
Anesthesia Evaluation  Patient identified by MRN, date of birth, ID band Patient awake    Reviewed: Allergy & Precautions, H&P , NPO status , Patient's Chart, lab work & pertinent test results, reviewed documented beta blocker date and time   Airway Mallampati: II  TM Distance: >3 FB Neck ROM: full    Dental  (+) Teeth Intact   Pulmonary neg pulmonary ROS,    Pulmonary exam normal        Cardiovascular Exercise Tolerance: Poor hypertension, On Medications negative cardio ROS Normal cardiovascular exam Rhythm:regular Rate:Normal     Neuro/Psych PSYCHIATRIC DISORDERS Depression negative neurological ROS  negative psych ROS   GI/Hepatic negative GI ROS, Neg liver ROS,   Endo/Other  negative endocrine ROS  Renal/GU Renal diseasenegative Renal ROS  negative genitourinary   Musculoskeletal   Abdominal   Peds  Hematology negative hematology ROS (+)   Anesthesia Other Findings Past Medical History: No date: Cirrhosis (HCC) No date: Factor 5 Leiden mutation, heterozygous (HCC) No date: Pulmonary embolism (HCC) Past Surgical History: No date: CAROTID STENT BMI    Body Mass Index:  22.15 kg/m     Reproductive/Obstetrics negative OB ROS                             Anesthesia Physical Anesthesia Plan  ASA: IV and emergent  Anesthesia Plan: General ETT   Post-op Pain Management:    Induction: Inhalational  PONV Risk Score and Plan:   Airway Management Planned: Oral ETT  Additional Equipment:   Intra-op Plan:   Post-operative Plan:   Informed Consent: I have reviewed the patients History and Physical, chart, labs and discussed the procedure including the risks, benefits and alternatives for the proposed anesthesia with the patient or authorized representative who has indicated his/her understanding and acceptance.   Dental Advisory Given  Plan Discussed with: CRNA  Anesthesia  Plan Comments: (Pt is intubated and HD unstable with active GI bleed.  JA)        Anesthesia Quick Evaluation

## 2018-04-19 LAB — PREPARE FRESH FROZEN PLASMA
UNIT DIVISION: 0
Unit division: 0
Unit division: 0

## 2018-04-19 LAB — BPAM RBC
BLOOD PRODUCT EXPIRATION DATE: 201907232359
BLOOD PRODUCT EXPIRATION DATE: 201908052359
BLOOD PRODUCT EXPIRATION DATE: 201908052359
Blood Product Expiration Date: 201907242359
Blood Product Expiration Date: 201908042359
Blood Product Expiration Date: 201908042359
Blood Product Expiration Date: 201908052359
Blood Product Expiration Date: 201908052359
Blood Product Expiration Date: 201908052359
Blood Product Expiration Date: 201908052359
ISSUE DATE / TIME: 201907032355
ISSUE DATE / TIME: 201907032355
ISSUE DATE / TIME: 201907040108
ISSUE DATE / TIME: 201907040108
ISSUE DATE / TIME: 201907040259
ISSUE DATE / TIME: 201907040259
UNIT TYPE AND RH: 5100
UNIT TYPE AND RH: 5100
UNIT TYPE AND RH: 5100
UNIT TYPE AND RH: 5100
UNIT TYPE AND RH: 5100
UNIT TYPE AND RH: 5100
Unit Type and Rh: 5100
Unit Type and Rh: 5100
Unit Type and Rh: 5100
Unit Type and Rh: 5100

## 2018-04-19 LAB — BPAM FFP
BLOOD PRODUCT EXPIRATION DATE: 201907092359
Blood Product Expiration Date: 201907092359
Blood Product Expiration Date: 201907092359
ISSUE DATE / TIME: 201907040153
ISSUE DATE / TIME: 201907040153
UNIT TYPE AND RH: 6200
Unit Type and Rh: 6200
Unit Type and Rh: 6200

## 2018-04-19 LAB — TYPE AND SCREEN
ABO/RH(D): A POS
ANTIBODY SCREEN: NEGATIVE
UNIT DIVISION: 0
UNIT DIVISION: 0
UNIT DIVISION: 0
UNIT DIVISION: 0
Unit division: 0
Unit division: 0
Unit division: 0
Unit division: 0
Unit division: 0
Unit division: 0

## 2018-04-19 MED ORDER — FENTANYL CITRATE (PF) 2500 MCG/50ML IJ SOLN
50.00 | INTRAMUSCULAR | Status: DC
Start: ? — End: 2018-04-19

## 2018-04-20 MED ORDER — SODIUM CHLORIDE 0.9 % IV SOLN
10.00 | INTRAVENOUS | Status: DC
Start: ? — End: 2018-04-20

## 2018-04-24 MED ORDER — GUAIFENESIN-DM 100-10 MG/5ML PO SYRP
5.00 | ORAL_SOLUTION | ORAL | Status: DC
Start: ? — End: 2018-04-24

## 2018-04-24 MED ORDER — GENERIC EXTERNAL MEDICATION
0.00 | Status: DC
Start: ? — End: 2018-04-24

## 2018-04-24 MED ORDER — CEFEPIME HCL 2 GM/100ML IV SOLN
2.00 | INTRAVENOUS | Status: DC
Start: 2018-04-24 — End: 2018-04-24

## 2018-04-24 MED ORDER — HEPARIN SODIUM (PORCINE) 1000 UNIT/ML IJ SOLN
2000.00 | INTRAMUSCULAR | Status: DC
Start: ? — End: 2018-04-24

## 2018-04-24 MED ORDER — VANCOMYCIN HCL IN DEXTROSE 1-5 GM/200ML-% IV SOLN
1000.00 | INTRAVENOUS | Status: DC
Start: 2018-04-24 — End: 2018-04-24

## 2018-04-24 MED ORDER — GENERIC EXTERNAL MEDICATION
0.04 | Status: DC
Start: ? — End: 2018-04-24

## 2018-04-24 MED ORDER — MENTHOL 9.1 MG MT LOZG
1.00 | LOZENGE | OROMUCOSAL | Status: DC
Start: ? — End: 2018-04-24

## 2018-04-24 MED ORDER — METRONIDAZOLE IN NACL 5-0.79 MG/ML-% IV SOLN
500.00 | INTRAVENOUS | Status: DC
Start: 2018-04-24 — End: 2018-04-24

## 2018-04-24 MED ORDER — HEPARIN (PORCINE) IN NACL 100-0.45 UNIT/ML-% IJ SOLN
12.00 | INTRAMUSCULAR | Status: DC
Start: ? — End: 2018-04-24

## 2018-04-24 MED ORDER — SODIUM CHLORIDE 0.9 % IV SOLN
INTRAVENOUS | Status: DC
Start: ? — End: 2018-04-24

## 2018-04-25 MED ORDER — PANTOPRAZOLE SODIUM 40 MG PO TBEC
40.00 | DELAYED_RELEASE_TABLET | ORAL | Status: DC
Start: 2018-04-25 — End: 2018-04-25

## 2018-04-26 MED ORDER — HEPARIN SODIUM (PORCINE) 1000 UNIT/ML IJ SOLN
2000.00 | INTRAMUSCULAR | Status: DC
Start: ? — End: 2018-04-26

## 2018-04-26 MED ORDER — PANTOPRAZOLE SODIUM 40 MG PO TBEC
40.00 | DELAYED_RELEASE_TABLET | ORAL | Status: DC
Start: 2018-05-05 — End: 2018-04-26

## 2018-04-26 MED ORDER — POLYETHYLENE GLYCOL 3350 17 G PO PACK
17.00 | PACK | ORAL | Status: DC
Start: 2018-04-27 — End: 2018-04-26

## 2018-04-26 MED ORDER — HEPARIN (PORCINE) IN NACL 100-0.45 UNIT/ML-% IJ SOLN
12.00 | INTRAMUSCULAR | Status: DC
Start: ? — End: 2018-04-26

## 2018-04-27 MED ORDER — POLYETHYLENE GLYCOL 3350 17 G PO PACK
17.00 | PACK | ORAL | Status: DC
Start: 2018-05-04 — End: 2018-04-27

## 2018-04-30 MED ORDER — FUROSEMIDE 40 MG PO TABS
40.00 | ORAL_TABLET | ORAL | Status: DC
Start: 2018-04-30 — End: 2018-04-30

## 2018-05-01 MED ORDER — FUROSEMIDE 40 MG PO TABS
40.00 | ORAL_TABLET | ORAL | Status: DC
Start: 2018-05-04 — End: 2018-05-01

## 2018-05-03 MED ORDER — APIXABAN 5 MG PO TABS
5.00 | ORAL_TABLET | ORAL | Status: DC
Start: 2018-05-04 — End: 2018-05-03

## 2018-05-17 ENCOUNTER — Emergency Department: Payer: BLUE CROSS/BLUE SHIELD

## 2018-05-17 ENCOUNTER — Encounter: Payer: Self-pay | Admitting: Emergency Medicine

## 2018-05-17 ENCOUNTER — Other Ambulatory Visit: Payer: Self-pay

## 2018-05-17 ENCOUNTER — Emergency Department
Admission: EM | Admit: 2018-05-17 | Discharge: 2018-05-17 | Disposition: A | Discharge status: Home / Self Care | Payer: BLUE CROSS/BLUE SHIELD | Attending: Emergency Medicine | Admitting: Emergency Medicine

## 2018-05-17 DIAGNOSIS — R059 Cough, unspecified: Secondary | ICD-10-CM

## 2018-05-17 DIAGNOSIS — R188 Other ascites: Secondary | ICD-10-CM | POA: Diagnosis present

## 2018-05-17 DIAGNOSIS — N182 Chronic kidney disease, stage 2 (mild): Secondary | ICD-10-CM | POA: Insufficient documentation

## 2018-05-17 DIAGNOSIS — I129 Hypertensive chronic kidney disease with stage 1 through stage 4 chronic kidney disease, or unspecified chronic kidney disease: Secondary | ICD-10-CM | POA: Insufficient documentation

## 2018-05-17 DIAGNOSIS — J9811 Atelectasis: Secondary | ICD-10-CM | POA: Diagnosis not present

## 2018-05-17 DIAGNOSIS — R05 Cough: Secondary | ICD-10-CM

## 2018-05-17 DIAGNOSIS — Z79899 Other long term (current) drug therapy: Secondary | ICD-10-CM | POA: Diagnosis not present

## 2018-05-17 DIAGNOSIS — K7031 Alcoholic cirrhosis of liver with ascites: Secondary | ICD-10-CM

## 2018-05-17 LAB — BODY FLUID CELL COUNT WITH DIFFERENTIAL
Eos, Fluid: 0 %
Lymphs, Fluid: 52 %
MONOCYTE-MACROPHAGE-SEROUS FLUID: 22 %
NEUTROPHIL FLUID: 26 %
Other Cells, Fluid: 0 %
WBC FLUID: 533 uL

## 2018-05-17 LAB — CBC
HEMATOCRIT: 35.7 % — AB (ref 40.0–52.0)
HEMOGLOBIN: 11.7 g/dL — AB (ref 13.0–18.0)
MCH: 31 pg (ref 26.0–34.0)
MCHC: 32.7 g/dL (ref 32.0–36.0)
MCV: 94.9 fL (ref 80.0–100.0)
Platelets: 254 10*3/uL (ref 150–440)
RBC: 3.77 MIL/uL — ABNORMAL LOW (ref 4.40–5.90)
RDW: 19.1 % — ABNORMAL HIGH (ref 11.5–14.5)
WBC: 10.5 10*3/uL (ref 3.8–10.6)

## 2018-05-17 LAB — URINALYSIS, COMPLETE (UACMP) WITH MICROSCOPIC
BACTERIA UA: NONE SEEN
BILIRUBIN URINE: NEGATIVE
GLUCOSE, UA: NEGATIVE mg/dL
Hgb urine dipstick: NEGATIVE
KETONES UR: NEGATIVE mg/dL
LEUKOCYTES UA: NEGATIVE
NITRITE: NEGATIVE
PH: 6 (ref 5.0–8.0)
PROTEIN: NEGATIVE mg/dL
Specific Gravity, Urine: 1.008 (ref 1.005–1.030)

## 2018-05-17 LAB — PATHOLOGIST SMEAR REVIEW

## 2018-05-17 LAB — COMPREHENSIVE METABOLIC PANEL
ALBUMIN: 2.5 g/dL — AB (ref 3.5–5.0)
ALK PHOS: 269 U/L — AB (ref 38–126)
ALT: 31 U/L (ref 0–44)
ANION GAP: 8 (ref 5–15)
AST: 39 U/L (ref 15–41)
BILIRUBIN TOTAL: 6 mg/dL — AB (ref 0.3–1.2)
BUN: 40 mg/dL — ABNORMAL HIGH (ref 6–20)
CALCIUM: 9.2 mg/dL (ref 8.9–10.3)
CO2: 23 mmol/L (ref 22–32)
Chloride: 103 mmol/L (ref 98–111)
Creatinine, Ser: 2.83 mg/dL — ABNORMAL HIGH (ref 0.61–1.24)
GFR calc non Af Amer: 24 mL/min — ABNORMAL LOW (ref >60)
GFR, EST AFRICAN AMERICAN: 28 mL/min — AB (ref >60)
GLUCOSE: 133 mg/dL — AB (ref 70–99)
POTASSIUM: 4.4 mmol/L (ref 3.5–5.1)
SODIUM: 134 mmol/L — AB (ref 135–145)
TOTAL PROTEIN: 7 g/dL (ref 6.5–8.1)

## 2018-05-17 LAB — LIPASE, BLOOD: Lipase: 75 U/L — ABNORMAL HIGH (ref 11–51)

## 2018-05-17 LAB — PROTIME-INR
INR: 2.15
Prothrombin Time: 23.8 seconds — ABNORMAL HIGH (ref 11.4–15.2)

## 2018-05-17 LAB — APTT: APTT: 42 s — AB (ref 24–36)

## 2018-05-17 NOTE — ED Triage Notes (Signed)
Pt comes into the ED via POV c/o abdominal distention.  Patient has fluid build up on his abdomen at this time and states he had surgery on his liver 2 weeks ago due to a blood clot being present in his liver.  Patient denies any chest pain, shortness of breath, or dizziness.  Patient denies any N/V,  Patient in NAD at this time with even and unlabored respirations.

## 2018-05-17 NOTE — ED Notes (Signed)
Resumed care from teresa rn.  Pt alert.  Pt up to bathroom.  No family with pt.

## 2018-05-17 NOTE — Procedures (Signed)
Pre Procedural Dx: Symptomatic Ascites Post Procedural Dx: Same  Successful US guided paracentesis yielding 4.8 L of dark serous ascitic fluid. Sample sent to laboratory as requested.  EBL: None  Complications: None immediate  Katherina RightJay Pascuala Klutts, MD Pager #: [phone]

## 2018-05-17 NOTE — ED Notes (Signed)
Patient transported to Ultrasound 

## 2018-05-17 NOTE — ED Provider Notes (Addendum)
[facility] Emergency Department Provider Note ____________________________________________   First MD Initiated Contact with Patient 05/17/18 1147     (approximate)  I have reviewed the triage vital signs and the nursing notes.   HISTORY  Chief Complaint Abdominal distention  HPI AIZIK REH is a 52 y.o. male with history of cirrhosis as well as factor V Leiden and pulmonary embolism on Eliquis with recent history of massive GI bleed who is presenting to the emergency department abdominal distention.  He says that he has needed paracentesis in the past.  Says that the pain is minimal.  Denies any fever.  Also with a cough over the past month prior to his recent intubation.   Past Medical History:  Diagnosis Date  . Cirrhosis (HCC)   . Factor 5 Leiden mutation, heterozygous (HCC)   . Pulmonary embolism [facility])     Patient Active Problem List   Diagnosis Date Noted  . Acute upper GI bleeding 04/18/2018  . Cirrhosis of liver (HCC) 01/09/2018  . Chronic cough 09/30/2017  . Inguinal hernia 11/18/2016  . Rectal bleeding 11/18/2016  . 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 Depression 04/11/2015  . Hyperlipemia 05/29/2014  . Erectile dysfunction 03/24/2014  . Current non-adherence to medical treatment 08/09/2013  . Health care maintenance 01/31/2013  . 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 Hypertension, benign 11/27/2008  . 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 Diastolic dysfunction 08/09/2007  . Sarcoidosis 07/06/2001    Past Surgical History:  Procedure Laterality Date  . CAROTID STENT      Prior to Admission medications   Medication Sig Start Date End Date Taking? Authorizing Provider  apixaban (ELIQUIS) 5 MG TABS tablet Take 5 mg by mouth 2 (two) times daily. 03/21/16  Yes [provider]  furosemide (LASIX) 40 MG tablet Take 1 tablet (40 mg total) by mouth daily. 02/07/18  Yes Midge Minium, MD  nadolol  (CORGARD) 40 MG tablet Take 1 tablet (40 mg total) by mouth daily. 01/31/18  Yes Midge Minium, MD  pantoprazole (PROTONIX) 40 MG tablet Take 1 tablet by mouth daily. 05/15/18  Yes [provider]  potassium chloride 20 MEQ TBCR Take 20 mEq by mouth daily. Patient taking differently: Take 20 mEq by mouth 2 (two) times daily.  01/10/18  Yes Sudini, Wardell Heath, MD  spironolactone (ALDACTONE) 100 MG tablet Take 1 tablet (100 mg total) by mouth daily. 02/07/18  Yes Midge Minium, MD  amLODipine (NORVASC) 5 MG tablet Take 1 tablet (5 mg total) by mouth daily. Patient not taking: Reported on 01/31/2018 01/10/18   Milagros Loll, MD    Allergies Diphenhydramine hcl  No family history on file.  Social History Social History   Tobacco Use  . Smoking status: Never Smoker  . Smokeless tobacco: Never Used  Substance Use Topics  . Alcohol use: No  . Drug use: No    Review of Systems  Constitutional: No fever/chills Eyes: No visual changes. ENT: No sore throat. Cardiovascular: Denies chest pain. Respiratory: Nonproductive cough. Gastrointestinal:  No nausea, no vomiting.  No diarrhea.  No constipation. Genitourinary: Negative for dysuria. Musculoskeletal: Negative for back pain. Skin: Negative for rash. Neurological: Negative for headaches, focal weakness or numbness.   ____________________________________________   PHYSICAL EXAM:  VITAL SIGNS: ED Triage Vitals  Enc Vitals Group     BP 05/17/18 1127 106/68     Pulse Rate 05/17/18 1127 74     Resp 05/17/18 1127 18  Temp 05/17/18 1127 97.6 F (36.4 C)     Temp Source 05/17/18 1127 Oral     SpO2 05/17/18 1127 99 %     Weight 05/17/18 1128 150 lb (68 kg)     Height 05/17/18 1128 5\' 9"  (1.753 m)     Head Circumference --      Peak Flow --      Pain Score 05/17/18 1128 0     Pain Loc --      Pain Edu? --      Excl. in GC? --     Constitutional: Alert and oriented. Well appearing and in no acute distress. Eyes:  Conjunctivae are normal.  Head: Atraumatic. Nose: No congestion/rhinnorhea. Mouth/Throat: Mucous membranes are moist.  Neck: No stridor.   Cardiovascular: Normal rate, regular rhythm. Grossly normal heart sounds.   Respiratory: Normal respiratory effort.  No retractions. Lungs CTAB. Gastrointestinal: Soft and nontender.  Mild distention with fluid wave present. Musculoskeletal: No lower extremity tenderness nor edema.  No joint effusions. Neurologic:  Normal speech and language. No gross focal neurologic deficits are appreciated. Skin:  Skin is warm, dry and intact. No rash noted. Psychiatric: Mood and affect are normal. Speech and behavior are normal.  ____________________________________________   LABS (all labs ordered are listed, but only abnormal results are displayed)  Labs Reviewed  LIPASE, BLOOD - Abnormal; Notable for the following components:      Result Value   Lipase 75 (*)    All other components within normal limits  COMPREHENSIVE METABOLIC PANEL - Abnormal; Notable for the following components:   Sodium 134 (*)    Glucose, Bld 133 (*)    BUN 40 (*)    Creatinine, Ser 2.83 (*)    Albumin 2.5 (*)    Alkaline Phosphatase 269 (*)    Total Bilirubin 6.0 (*)    GFR calc non Af Amer 24 (*)    GFR calc Af Amer 28 (*)    All other components within normal limits  CBC - Abnormal; Notable for the following components:   RBC 3.77 (*)    Hemoglobin 11.7 (*)    HCT 35.7 (*)    RDW 19.1 (*)    All other components within normal limits  URINALYSIS, COMPLETE (UACMP) WITH MICROSCOPIC - Abnormal; Notable for the following components:   Color, Urine YELLOW (*)    APPearance CLEAR (*)    All other components within normal limits  PROTIME-INR - Abnormal; Notable for the following components:   Prothrombin Time 23.8 (*)    All other components within normal limits  APTT - Abnormal; Notable for the following components:   aPTT 42 (*)    All other components within normal  limits  BODY FLUID CELL COUNT WITH DIFFERENTIAL - Abnormal; Notable for the following components:   Color, Fluid YELLOW (*)    Appearance, Fluid HAZY (*)    All other components within normal limits  GRAM STAIN  BODY FLUID CULTURE  AEROBIC/ANAEROBIC CULTURE (SURGICAL/DEEP WOUND)  PATHOLOGIST SMEAR REVIEW   ____________________________________________  EKG   ____________________________________________  RADIOLOGY   ____________________________________________   PROCEDURES  Procedure(s) performed:   Procedures  Critical Care performed:   ____________________________________________   INITIAL IMPRESSION / ASSESSMENT AND PLAN / ED COURSE  Pertinent labs & imaging results that were available during my care of the patient were reviewed by me and considered in my medical decision making (see chart for details).  Differential diagnosis includes, but is not limited to, acute appendicitis,  renal colic, testicular torsion, urinary tract infection/pyelonephritis, prostatitis,  epididymitis, diverticulitis, small bowel obstruction or ileus, colitis, abdominal aortic aneurysm, gastroenteritis, hernia, etc. As part of my medical decision making, I reviewed the following data within the electronic MEDICAL RECORD NUMBER Notes from prior ED visits  ----------------------------------------- 4:04 PM on 05/17/2018 -----------------------------------------  With 4.8 L of fluid removed from his abdomen.  Patient says that he feels much improved at this time.  I palpated his abdomen and he is nontender.  When he 6 PMNs.  No fever.  No white count.  Elevated bilirubin but appears to be close to his baseline fluctuation in his bilirubin.  I discussed this with Dr. Tobi Bastos who recommends that he follow-up with GI at [facility] as planned.  Patient understanding of the treatment plan and willing to comply.  Says that he has a spirometer at home for the atelectasis and cough and will continue to use this.  To be  discharged at this time. ____________________________________________   FINAL CLINICAL IMPRESSION(S) / ED DIAGNOSES  Final diagnoses:  Ascites  Cough.    NEW MEDICATIONS STARTED DURING THIS VISIT:  New Prescriptions   No medications on file     Note:  This document was prepared using Dragon voice recognition software and may include unintentional dictation errors.     Myrna Blazer, MD 05/17/18 1606  No leakage from the paracentesis site.    Myrna Blazer, MD 05/17/18 [phone]

## 2018-05-17 NOTE — ED Notes (Signed)
Lab called and stated that blue top had hemolyzed - requested that lab come and redraw sample

## 2018-05-21 LAB — BODY FLUID CULTURE: Culture: NO GROWTH

## 2018-08-05 ENCOUNTER — Encounter: Payer: Self-pay | Admitting: *Deleted

## 2018-08-05 ENCOUNTER — Ambulatory Visit: Payer: BLUE CROSS/BLUE SHIELD | Admitting: Gastroenterology

## 2018-08-05 DIAGNOSIS — K922 Gastrointestinal hemorrhage, unspecified: Secondary | ICD-10-CM

## 2018-08-05 NOTE — Progress Notes (Deleted)
    Primary Care Physician: [facility]  Primary Gastroenterologist:  Dr. Midge Minium  No chief complaint on file.   HPI: [facility] is a 52 y.o. male here for follow-up after being in the emergency room recently.  The patient has a history of cirrhosis thought to be from recurrent [place] of hypercoagulation due to factor V Leiden deficiency.  The patient had a upper GI bleed in July and was airlifted to [facility] [place].  The patient then came back to [facility] ER for abdominal distention and had a paracentesis and sent home.  At that time our practice was contacted and Dr. Tobi Bastos recommended the patient follow-up at [facility].  The patient had been seen at [facility] in the past and had also been seen by me back in April.  Despite that recommendation the patient is here to see me today.  She has been on Eliquis for his hypercoagulable state.  Current Outpatient Medications  Medication Sig Dispense Refill  . amLODipine (NORVASC) 5 MG tablet Take 1 tablet (5 mg total) by mouth daily. (Patient not taking: Reported on 01/31/2018) 30 tablet 0  . apixaban (ELIQUIS) 5 MG TABS tablet Take 5 mg by mouth 2 (two) times daily.    . furosemide (LASIX) 40 MG tablet Take 1 tablet (40 mg total) by mouth daily. 30 tablet 6  . nadolol (CORGARD) 40 MG tablet Take 1 tablet (40 mg total) by mouth daily. 30 tablet 6  . pantoprazole (PROTONIX) 40 MG tablet Take 1 tablet by mouth daily.  0  . potassium chloride 20 MEQ TBCR Take 20 mEq by mouth daily. (Patient taking differently: Take 20 mEq by mouth 2 (two) times daily. ) 30 tablet 0  . spironolactone (ALDACTONE) 100 MG tablet Take 1 tablet (100 mg total) by mouth daily. 30 tablet 6   No current facility-administered medications for this visit.     Allergies as of 08/05/2018 - Review Complete 05/17/2018  Allergen Reaction Noted  . Diphenhydramine hcl Nausea And Vomiting 12/26/2016    ROS:  General: Negative for anorexia, weight loss, fever,  chills, fatigue, weakness. ENT: Negative for hoarseness, difficulty swallowing , nasal congestion. CV: Negative for chest pain, angina, palpitations, dyspnea on exertion, peripheral edema.  Respiratory: Negative for dyspnea at rest, dyspnea on exertion, cough, sputum, wheezing.  GI: See history of present illness. GU:  Negative for dysuria, hematuria, urinary incontinence, urinary frequency, nocturnal urination.  Endo: Negative for unusual weight change.    Physical Examination:   There were no vitals taken for this visit.  General: Well-nourished, well-developed in no acute distress.  Eyes: No icterus. Conjunctivae pink. Mouth: Oropharyngeal mucosa moist and pink , no lesions erythema or exudate. Lungs: Clear to auscultation bilaterally. Non-labored. Heart: Regular rate and rhythm, no murmurs rubs or gallops.  Abdomen: Bowel sounds are normal, nontender, nondistended, no hepatosplenomegaly or masses, no abdominal bruits or hernia , no rebound or guarding.   Extremities: No lower extremity edema. No clubbing or deformities. Neuro: Alert and oriented x 3.  Grossly intact. Skin: Warm and dry, no jaundice.   Psych: Alert and cooperative, normal mood and affect.  Labs:  ***  Imaging Studies: No results found.  Assessment and Plan:   [facility] is a 52 y.o. y/o male ***    Midge Minium, MD. Clementeen Graham   Note: This dictation was prepared with Dragon dictation along with smaller phrase technology. Any transcriptional errors that result from this process are unintentional.

## 2019-01-05 ENCOUNTER — Encounter: Payer: Self-pay | Admitting: Emergency Medicine

## 2019-01-05 ENCOUNTER — Other Ambulatory Visit: Payer: Self-pay

## 2019-01-05 ENCOUNTER — Emergency Department
Admission: EM | Admit: 2019-01-05 | Discharge: 2019-01-05 | Disposition: A | Discharge status: Home / Self Care | Payer: BLUE CROSS/BLUE SHIELD | Attending: Emergency Medicine | Admitting: Emergency Medicine

## 2019-01-05 DIAGNOSIS — M79671 Pain in right foot: Secondary | ICD-10-CM | POA: Diagnosis present

## 2019-01-05 DIAGNOSIS — Z79899 Other long term (current) drug therapy: Secondary | ICD-10-CM | POA: Diagnosis not present

## 2019-01-05 DIAGNOSIS — E785 Hyperlipidemia, unspecified: Secondary | ICD-10-CM | POA: Insufficient documentation

## 2019-01-05 DIAGNOSIS — B070 Plantar wart: Secondary | ICD-10-CM | POA: Diagnosis not present

## 2019-01-05 DIAGNOSIS — Z86711 Personal history of pulmonary embolism: Secondary | ICD-10-CM | POA: Insufficient documentation

## 2019-01-05 NOTE — ED Provider Notes (Signed)
[facility] Emergency Department Provider Note   ____________________________________________   First MD Initiated Contact with Patient 01/05/19 [phone]     (approximate)  I have reviewed the triage vital signs and the nursing notes.   HISTORY  Chief Complaint Foot Pain   HPI [facility] is a 53 y.o. male presents to the ED with complaint of a "knot" to his right heel is been hurting for the last 4 weeks.  Patient has been rubbing alcohol to the area without any relief.  He denies taking any over-the-counter medication.  There is been no history of injury.  He rates his pain as 9/10.     Past Medical History:  Diagnosis Date  . Cirrhosis (HCC)   . Factor 5 Leiden mutation, heterozygous (HCC)   . Pulmonary embolism [facility])     Patient Active Problem List   Diagnosis Date Noted  . Acute upper GI bleeding 04/18/2018  . Cirrhosis of liver (HCC) 01/09/2018  . Chronic cough 09/30/2017  . Inguinal hernia 11/18/2016  . Rectal bleeding 11/18/2016  . 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 Depression 04/11/2015  . Hyperlipemia 05/29/2014  . Erectile dysfunction 03/24/2014  . Current non-adherence to medical treatment 08/09/2013  . Health care maintenance 01/31/2013  . 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 Hypertension, benign 11/27/2008  . 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 Diastolic dysfunction 08/09/2007  . Sarcoidosis 07/06/2001    Past Surgical History:  Procedure Laterality Date  . CAROTID STENT      Prior to Admission medications   Medication Sig Start Date End Date Taking? Authorizing Provider  amLODipine (NORVASC) 5 MG tablet Take 1 tablet (5 mg total) by mouth daily. Patient not taking: Reported on 01/31/2018 01/10/18   Milagros Loll, MD  apixaban (ELIQUIS) 5 MG TABS tablet Take 5 mg by mouth 2 (two) times daily. 03/21/16   [provider]  furosemide (LASIX) 40 MG tablet Take 1 tablet (40 mg total) by  mouth daily. 02/07/18   Midge Minium, MD  nadolol (CORGARD) 40 MG tablet Take 1 tablet (40 mg total) by mouth daily. 01/31/18   Midge Minium, MD  pantoprazole (PROTONIX) 40 MG tablet Take 1 tablet by mouth daily. 05/15/18   [provider]  potassium chloride 20 MEQ TBCR Take 20 mEq by mouth daily. Patient taking differently: Take 20 mEq by mouth 2 (two) times daily.  01/10/18   Milagros Loll, MD  spironolactone (ALDACTONE) 100 MG tablet Take 1 tablet (100 mg total) by mouth daily. 02/07/18   Midge Minium, MD    Allergies Diphenhydramine hcl  History reviewed. No pertinent family history.  Social History Social History   Tobacco Use  . Smoking status: Never Smoker  . Smokeless tobacco: Never Used  Substance Use Topics  . Alcohol use: No  . Drug use: No    Review of Systems Constitutional: No fever/chills Cardiovascular: Denies chest pain. Respiratory: Denies shortness of breath. Gastrointestinal:   No nausea, no vomiting.  Musculoskeletal: Positive right foot pain. Skin: Negative for rash. Neurological: Negative for  focal weakness or numbness. ___________________________________________   PHYSICAL EXAM:  VITAL SIGNS: ED Triage Vitals  Enc Vitals Group     BP 01/05/19 0833 (!) 146/85     Pulse Rate 01/05/19 0833 72     Resp 01/05/19 0833 18     Temp 01/05/19 0833 (!) 97.5 F (36.4 C)     Temp Source 01/05/19 0833 Oral  SpO2 01/05/19 0833 100 %     Weight 01/05/19 0834 152 lb (68.9 kg)     Height 01/05/19 0834 5\' 9"  (1.753 m)     Head Circumference --      Peak Flow --      Pain Score 01/05/19 0834 9     Pain Loc --      Pain Edu? --      Excl. in GC? --    Constitutional: Alert and oriented. Well appearing and in no acute distress. Eyes: Conjunctivae are normal.  Head: Atraumatic. Neck: No stridor.   Cardiovascular:  Good peripheral circulation. Respiratory: Normal respiratory effort.  Musculoskeletal: No gross deformity and patient is able to  move extremities without any difficulty.  Ambulates without any assistance. Neurologic:  Normal speech and language. No gross focal neurologic deficits are appreciated. No gait instability. Skin:  Skin is warm, dry and intact.  There is a plantars wart present on the right foot without erythema or drainage.  Area is tender to palpation.  Pain is increased with pressure over this area. Psychiatric: Mood and affect are normal. Speech and behavior are normal.  ____________________________________________   LABS (all labs ordered are listed, but only abnormal results are displayed)  Labs Reviewed - No data to display   PROCEDURES  Procedure(s) performed (including Critical Care):  Procedures   ____________________________________________   INITIAL IMPRESSION / ASSESSMENT AND PLAN / ED COURSE  As part of my medical decision making, I reviewed the following data within the electronic MEDICAL RECORD NUMBER Notes from prior ED visits and [place] Controlled Substance Database  53 year old male presents to the ED with complaint of right foot pain for 4 weeks.  There is been no history of injury.  On exam there is an obvious plantar wart present that is tender to palpation.  No other abnormalities noted.  Patient was made aware that there are cushioned doughnuts to apply to this area.  He was also given contact information for the podiatrist on call to follow-up with.  He was encouraged to take Tylenol or ibuprofen if needed for pain.  ____________________________________________   FINAL CLINICAL IMPRESSION(S) / ED DIAGNOSES  Final diagnoses:  Plantar wart of right foot     ED Discharge Orders    None       Note:  This document was prepared using Dragon voice recognition software and may include unintentional dictation errors.    Tommi Rumps, PA-C 01/05/19 0955    Nita Sickle, MD 01/05/19 [phone]

## 2019-01-05 NOTE — Discharge Instructions (Addendum)
Call make an appointment with the podiatry department at [facility].  Dr. Dory Larsen contact information is listed in your discharge papers.  Obtain a cushion doughnut that can be located in the foot section of most stores including [employer], [employer], [employer] or [facility].  You may also take Tylenol or ibuprofen if needed for pain.

## 2019-01-05 NOTE — ED Triage Notes (Signed)
Pt presents via pov from home with right foot pain x 3-4 weeks. He states that he has a "knot" in his right heel that has been hurting and has increased today. Pt walked to triage with no difficulty. Pt alert & oriented with NAD noted.

## 2019-12-27 ENCOUNTER — Emergency Department: Payer: BC Managed Care – PPO

## 2019-12-27 ENCOUNTER — Other Ambulatory Visit: Payer: Self-pay

## 2019-12-27 ENCOUNTER — Emergency Department
Admission: EM | Admit: 2019-12-27 | Discharge: 2019-12-27 | Disposition: A | Discharge status: Home / Self Care | Payer: BC Managed Care – PPO | Attending: Emergency Medicine | Admitting: Emergency Medicine

## 2019-12-27 ENCOUNTER — Encounter: Payer: Self-pay | Admitting: Emergency Medicine

## 2019-12-27 DIAGNOSIS — Z86711 Personal history of pulmonary embolism: Secondary | ICD-10-CM | POA: Insufficient documentation

## 2019-12-27 DIAGNOSIS — M79672 Pain in left foot: Secondary | ICD-10-CM | POA: Diagnosis present

## 2019-12-27 DIAGNOSIS — B070 Plantar wart: Secondary | ICD-10-CM | POA: Insufficient documentation

## 2019-12-27 DIAGNOSIS — Y939 Activity, unspecified: Secondary | ICD-10-CM | POA: Insufficient documentation

## 2019-12-27 DIAGNOSIS — Z7901 Long term (current) use of anticoagulants: Secondary | ICD-10-CM | POA: Diagnosis not present

## 2019-12-27 DIAGNOSIS — B351 Tinea unguium: Secondary | ICD-10-CM | POA: Insufficient documentation

## 2019-12-27 DIAGNOSIS — W208XXA Other cause of strike by thrown, projected or falling object, initial encounter: Secondary | ICD-10-CM | POA: Diagnosis not present

## 2019-12-27 DIAGNOSIS — D6851 Activated protein C resistance: Secondary | ICD-10-CM | POA: Insufficient documentation

## 2019-12-27 DIAGNOSIS — Y929 Unspecified place or not applicable: Secondary | ICD-10-CM | POA: Insufficient documentation

## 2019-12-27 DIAGNOSIS — Z79899 Other long term (current) drug therapy: Secondary | ICD-10-CM | POA: Diagnosis not present

## 2019-12-27 DIAGNOSIS — Y999 Unspecified external cause status: Secondary | ICD-10-CM | POA: Diagnosis not present

## 2019-12-27 DIAGNOSIS — K7460 Unspecified cirrhosis of liver: Secondary | ICD-10-CM | POA: Diagnosis not present

## 2019-12-27 MED ORDER — SALICYLIC ACID 17 % EX GEL: Freq: Every day | CUTANEOUS | 0 refills | Status: DC

## 2019-12-27 NOTE — ED Provider Notes (Signed)
[facility] Emergency Department Provider Note   ____________________________________________   First MD Initiated Contact with Patient 12/27/19 1008     (approximate)  I have reviewed the triage vital signs and the nursing notes.   HISTORY  Chief Complaint Foot Injury    HPI Christian Fuentes is a 54 y.o. male patient complain of left foot pain involving the second and third toe secondary to contusion 1 week ago.  Patient state after dropping a box on the told it was immediate pain and edema.  Patient takes a blood thinner secondary to DVT and was expecting the bruising and pain.  Patient states there is now resolving ecchymosis with continued pain between the second and third digit.  Patient also requests evaluation for deformed toenails secondary fungal infection.  Patient rates pain as a 9/10.  Patient described pain as "achy".  He states pain increased with weightbearing and ambulation.         Past Medical History:  Diagnosis Date  . Cirrhosis ([place])   . Factor 5 Leiden mutation, heterozygous ([place])   . Pulmonary embolism [facility])     Patient Active Problem List   Diagnosis Date Noted  . Acute upper GI bleeding 04/18/2018  . Cirrhosis of liver ([place]) 01/09/2018  . Chronic cough 09/30/2017  . Inguinal hernia 11/18/2016  . Rectal bleeding 11/18/2016  . 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 Depression 04/11/2015  . Hyperlipemia 05/29/2014  . Erectile dysfunction 03/24/2014  . Current non-adherence to medical treatment 08/09/2013  . Health care maintenance 01/31/2013  . 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 Hypertension, benign 11/27/2008  . 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 Diastolic dysfunction 33/82/5053  . Sarcoidosis 07/06/2001    Past Surgical History:  Procedure Laterality Date  . CAROTID STENT      Prior to Admission medications   Medication Sig Start Date End Date Taking? Authorizing Provider    amLODipine (NORVASC) 5 MG tablet Take 1 tablet (5 mg total) by mouth daily. Patient not taking: Reported on 01/31/2018 01/10/18   Hillary Bow, MD  apixaban (ELIQUIS) 5 MG TABS tablet Take 5 mg by mouth 2 (two) times daily. 03/21/16   [provider]  furosemide (LASIX) 40 MG tablet Take 1 tablet (40 mg total) by mouth daily. 02/07/18   Lucilla Lame, MD  nadolol (CORGARD) 40 MG tablet Take 1 tablet (40 mg total) by mouth daily. 01/31/18   Lucilla Lame, MD  pantoprazole (PROTONIX) 40 MG tablet Take 1 tablet by mouth daily. 05/15/18   [provider]  potassium chloride 20 MEQ TBCR Take 20 mEq by mouth daily. Patient taking differently: Take 20 mEq by mouth 2 (two) times daily.  01/10/18   Hillary Bow, MD  salicylic acid 17 % gel Apply topically daily. 12/27/19   Sable Feil, PA-C  spironolactone (ALDACTONE) 100 MG tablet Take 1 tablet (100 mg total) by mouth daily. 02/07/18   Lucilla Lame, MD    Allergies Diphenhydramine hcl  History reviewed. No pertinent family history.  Social History Social History   Tobacco Use  . Smoking status: Never Smoker  . Smokeless tobacco: Never Used  Substance Use Topics  . Alcohol use: No  . Drug use: No    Review of Systems Constitutional: No fever/chills Eyes: No visual changes. ENT: No sore throat. Cardiovascular: Denies chest pain. Respiratory: Denies shortness of breath. Gastrointestinal: No abdominal pain.  No nausea, no vomiting.  No diarrhea.  No constipation. Genitourinary:  Negative for dysuria. Musculoskeletal: Negative for back pain. Skin: Negative for rash. Neurological: Negative for headaches, focal weakness or numbness. Endocrine:  Cirrhosis, hyperlipidemia, and hypertension Allergic/Immunilogical: Benadryl  ____________________________________________   PHYSICAL EXAM:  VITAL SIGNS: ED Triage Vitals  Enc Vitals Group     BP 12/27/19 0958 (!) 144/85     Pulse Rate 12/27/19 0958 73     Resp 12/27/19 0958  18     Temp --      Temp src --      SpO2 12/27/19 0958 100 %     Weight 12/27/19 0951 170 lb (77.1 kg)     Height 12/27/19 0951 5\' 9"  (1.753 m)     Head Circumference --      Peak Flow --      Pain Score 12/27/19 0951 9     Pain Loc --      Pain Edu? --      Excl. in GC? --    Constitutional: Alert and oriented. Well appearing and in no acute distress. Cardiovascular: Normal rate, regular rhythm. Grossly normal heart sounds.  Good peripheral circulation. Respiratory: Normal respiratory effort.  No retractions. Lungs CTAB. Gastrointestinal: Soft and nontender. No distention. No abdominal bruits. No CVA tenderness. Musculoskeletal: No lower extremity tenderness nor edema.  No joint effusions. Neurologic:  Normal speech and language. No gross focal neurologic deficits are appreciated. No gait instability. Skin:  Skin is warm, dry and intact. No rash noted. Psychiatric: Mood and affect are normal. Speech and behavior are normal.  ____________________________________________   LABS (all labs ordered are listed, but only abnormal results are displayed)  Labs Reviewed - No data to display ____________________________________________  EKG   ____________________________________________  RADIOLOGY  ED MD interpretation:    Official radiology report(s): DG Foot Complete Left  Result Date: 12/27/2019 CLINICAL DATA:  Acute LEFT foot pain following injury last week. Initial encounter. EXAM: LEFT FOOT - COMPLETE 3+ VIEW COMPARISON:  02/27/2015 FINDINGS: There is no evidence of fracture or dislocation. There is no evidence of arthropathy or other focal bone abnormality. Soft tissues are unremarkable. IMPRESSION: Negative. Electronically Signed   By: 03/01/2015 M.D.   On: 12/27/2019 10:45    ____________________________________________   PROCEDURES  Procedure(s) performed (including Critical Care):  Procedures   ____________________________________________   INITIAL  IMPRESSION / ASSESSMENT AND PLAN / ED COURSE  As part of my medical decision making, I reviewed the following data within the electronic MEDICAL RECORD NUMBER      Patient complained of foot pain involving the second and third digit of the left foot secondary to dropping boxes up on his foot last week.  Patient also has lesions between the second and third digit of the left foot.  Discussed negative  x-ray findings with patient.  Physical exam is consistent with plantar wart between the second and third digits of the left foot and onychomycosis.  Patient given discharge care instruction consult to podiatry for definitive evaluation and treatment.    Jeramyah Goodpasture Lehan was evaluated in Emergency Department on 12/27/2019 for the symptoms described in the history of present illness. He was evaluated in the context of the global COVID-19 pandemic, which necessitated consideration that the patient might be at risk for infection with the SARS-CoV-2 virus that causes COVID-19. Institutional protocols and algorithms that pertain to the evaluation of patients at risk for COVID-19 are in a state of rapid change based on information released by regulatory bodies including the CDC and federal and  state organizations. These policies and algorithms were followed during the patient's care in the ED.       ____________________________________________   FINAL CLINICAL IMPRESSION(S) / ED DIAGNOSES  Final diagnoses:  Plantar wart of left foot  Onychomycosis     ED Discharge Orders         Ordered    salicylic acid 17 % gel  Daily     12/27/19 1058           Note:  This document was prepared using Dragon voice recognition software and may include unintentional dictation errors.    Joni Reining, PA-C 12/27/19 1101    Concha Se, MD 12/27/19 [phone]

## 2019-12-27 NOTE — ED Triage Notes (Signed)
Pt ambulatory to triage with c/o left foot pain after dropping boxes on it last week.  Pt states takes blood thinners and toes are bruised.

## 2019-12-27 NOTE — Discharge Instructions (Signed)
Follow discharge care instructions and contact podiatry clinic Monday morning to schedule appointment for definitive evaluation and treatment of your toenail fungal infection.

## 2020-04-22 IMAGING — DX DG FOOT COMPLETE 3+V*L*
3 series · 3 of 3 positions shown · non-contrast
Comparison: 02/27/2015

CLINICAL DATA: Acute LEFT foot pain following injury last week.
Initial encounter.

EXAM:
LEFT FOOT - COMPLETE 3+ VIEW

[foot ap]
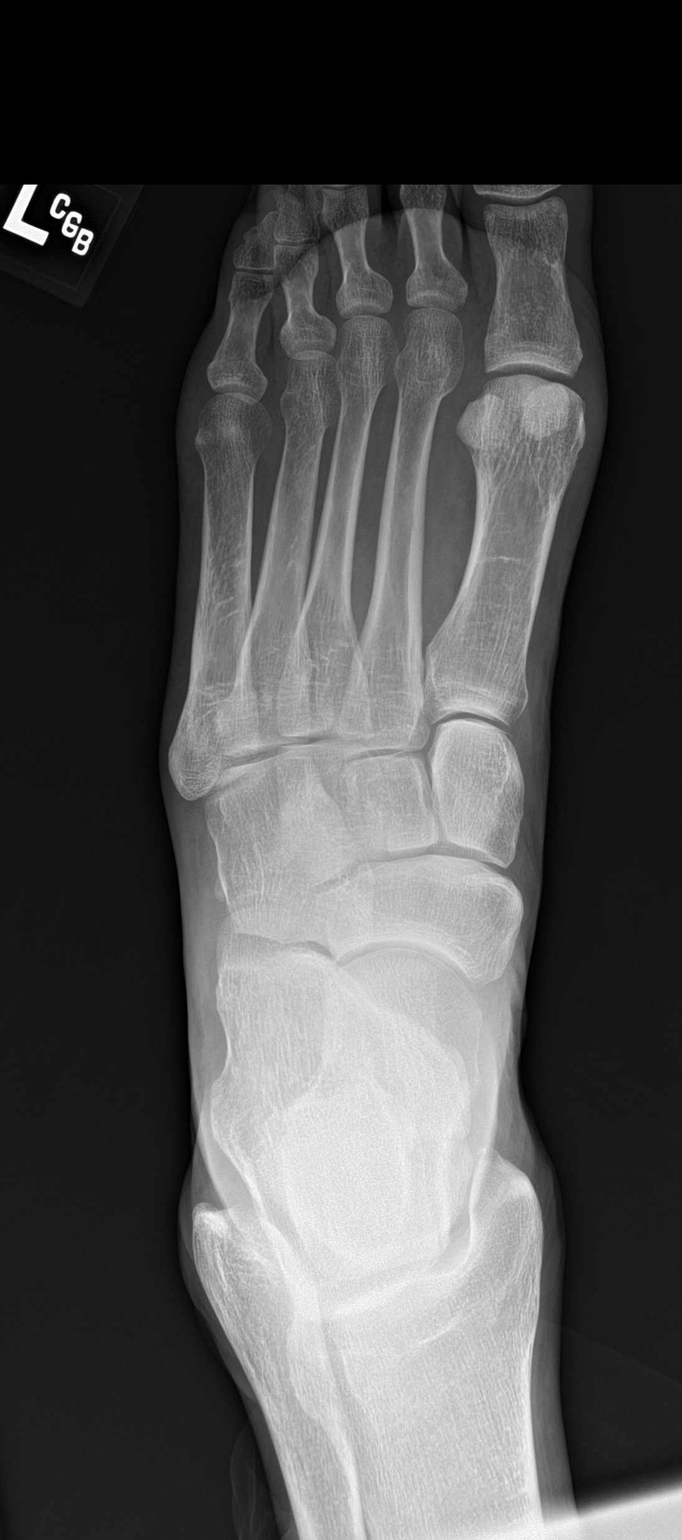

[foot obl]
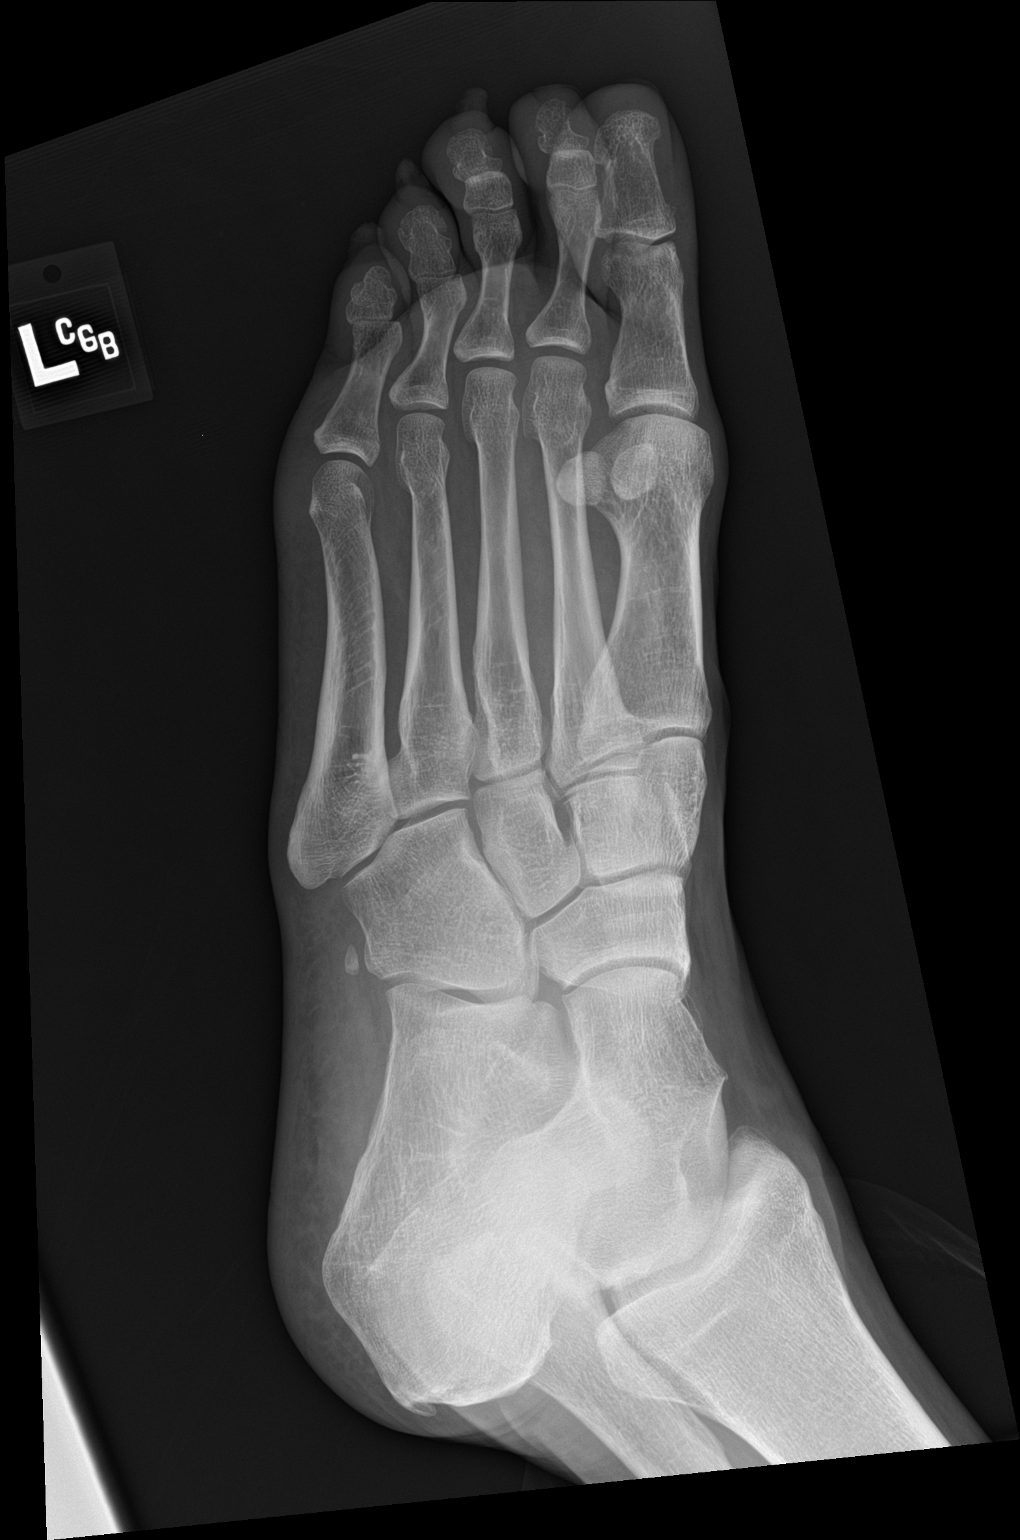

[foot lat]
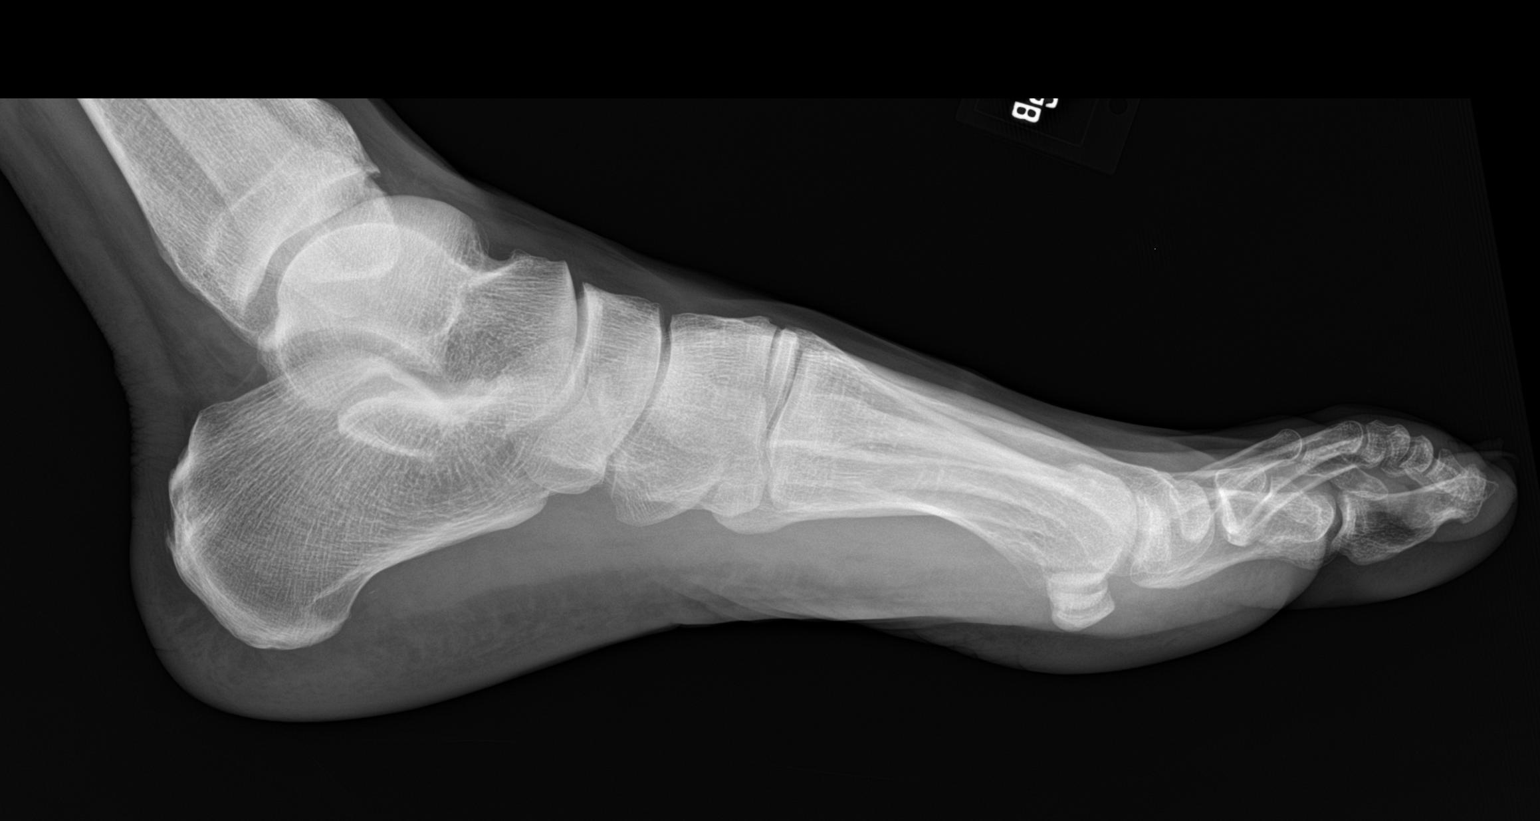

[3 of 3 positions shown; findings below may reference images not displayed]

FINDINGS: There is no evidence of fracture or dislocation. There is no
evidence of arthropathy or other focal bone abnormality. Soft
tissues are unremarkable.
IMPRESSION: Negative.

## 2021-07-08 ENCOUNTER — Other Ambulatory Visit: Payer: Self-pay

## 2021-07-08 ENCOUNTER — Encounter: Payer: Self-pay | Admitting: Emergency Medicine

## 2021-07-08 ENCOUNTER — Emergency Department: Payer: Managed Care, Other (non HMO)

## 2021-07-08 ENCOUNTER — Emergency Department
Admission: EM | Admit: 2021-07-08 | Discharge: 2021-07-08 | Disposition: A | Discharge status: Home / Self Care | Payer: Managed Care, Other (non HMO) | Attending: Emergency Medicine | Admitting: Emergency Medicine

## 2021-07-08 DIAGNOSIS — M5431 Sciatica, right side: Secondary | ICD-10-CM

## 2021-07-08 DIAGNOSIS — M545 Low back pain, unspecified: Secondary | ICD-10-CM | POA: Diagnosis present

## 2021-07-08 DIAGNOSIS — Z7902 Long term (current) use of antithrombotics/antiplatelets: Secondary | ICD-10-CM | POA: Insufficient documentation

## 2021-07-08 DIAGNOSIS — M5441 Lumbago with sciatica, right side: Secondary | ICD-10-CM | POA: Diagnosis not present

## 2021-07-08 DIAGNOSIS — N182 Chronic kidney disease, stage 2 (mild): Secondary | ICD-10-CM | POA: Insufficient documentation

## 2021-07-08 DIAGNOSIS — I129 Hypertensive chronic kidney disease with stage 1 through stage 4 chronic kidney disease, or unspecified chronic kidney disease: Secondary | ICD-10-CM | POA: Diagnosis not present

## 2021-07-08 DIAGNOSIS — M79604 Pain in right leg: Secondary | ICD-10-CM | POA: Insufficient documentation

## 2021-07-08 DIAGNOSIS — Z79899 Other long term (current) drug therapy: Secondary | ICD-10-CM | POA: Insufficient documentation

## 2021-07-08 MED ORDER — TRAMADOL HCL 50 MG PO TABS: 50.0000 mg | ORAL_TABLET | Freq: Four times a day (QID) | ORAL | 0 refills | Status: AC | PRN

## 2021-07-08 MED ORDER — METHYLPREDNISOLONE 4 MG PO TBPK: ORAL_TABLET | ORAL | 0 refills | Status: DC

## 2021-07-08 MED ORDER — METHYLPREDNISOLONE 4 MG PO TABS
8.0000 mg | ORAL_TABLET | Freq: Once | ORAL | Status: AC
  Administered 2021-07-08: 8 mg via ORAL
  Filled 2021-07-08: qty 2

## 2021-07-08 MED ORDER — ACETAMINOPHEN 500 MG PO TABS: 1000.0000 mg | ORAL_TABLET | Freq: Three times a day (TID) | ORAL | 0 refills | Status: AC | PRN

## 2021-07-08 MED ORDER — METHYLPREDNISOLONE 4 MG PO TBPK
8.0000 mg | ORAL_TABLET | Freq: Once | ORAL | Status: DC
  Filled 2021-07-08: qty 21

## 2021-07-08 MED ORDER — ACETAMINOPHEN 500 MG PO TABS
1000.0000 mg | ORAL_TABLET | Freq: Once | ORAL | Status: AC
  Administered 2021-07-08: 1000 mg via ORAL
  Filled 2021-07-08: qty 2

## 2021-07-08 MED ORDER — METHYLPREDNISOLONE 4 MG PO TABS
8.0000 mg | ORAL_TABLET | Freq: Once | ORAL | Status: DC
  Filled 2021-07-08: qty 2

## 2021-07-08 NOTE — ED Notes (Signed)
Reviewed discharge instructions, follow-up care, and prescriptions with patient. Patient verbalized understanding of all information reviewed. Patient stable, with no distress noted at this time.    

## 2021-07-08 NOTE — ED Provider Notes (Signed)
[facility] Emergency Department Provider Note  ____________________________________________  Time seen: Approximately 6:19 AM  I have reviewed the triage vital signs and the nursing notes.   HISTORY  Chief Complaint Back Pain and Leg Pain   HPI Christian Fuentes is a 55 y.o. 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  They got progressively worse over the following days.  2 days ago he reports that the pain was so severe he could barely walk.  The pain is improving.  He has tried over-the-counter medications including Tylenol, hot and cold compresses, muscle rub.  He describes the pain that started on the top of his right buttock and radiating like a shock like pain down the back of his right leg.  The pain is worse with movement and better when he sitting down.  No midline back pain, no trauma, no saddle anesthesia, no lower extremity weakness or numbness, no urinary or bowel incontinence or retention, no history of cancer, no history of IV drug use, no fever or chills, no chest pain or shortness of breath, no abdominal pain.   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 Authorizing Provider  acetaminophen (TYLENOL) 500 MG tablet Take 2 tablets (1,000 mg total) by mouth every 8 (eight) hours as needed for mild pain, moderate pain, fever or headache. 07/08/21 07/08/22 Yes Don Perking, [place], MD  methylPREDNISolone (MEDROL DOSEPAK) 4 MG TBPK tablet Day 1: 24 mg on day 1 administered as 8 mg (2 tablets) before breakfast, 4 mg (1 tablet) after lunch, 4 mg (1 tablet) after supper, and 8 mg (2 tablets) at bedtime.  Day 2: 20 mg on day 2 administered as 4 mg (1 tablet) before breakfast, 4 mg (1 tablet) after lunch, 4 mg (1 tablet) after supper, and 8 mg (2 tablets) at bedtime.  Day 3: 16 mg on day 3 administered as 4 mg (1 tablet) before breakfast, 4 mg (1 tablet) after lunch, 4 mg (1 tablet) after supper, and 4 mg (1 tablet) at bedtime.  Day 4: 12 mg on day 4 administered as 4 mg (1 tablet) before breakfast, 4 mg (1 tablet) after lunch, and 4 mg (1 tablet) at bedtime.  Day 5: 8 mg on day 5 administered as 4 mg (1 tablet) before breakfast and 4 mg (1 tablet) at bedtime.  Day 6: 4 mg on day 6 administered as  4 mg (1 tablet) before breakfast. 07/08/21  Yes Don Perking, [place], MD  traMADol (ULTRAM) 50 MG tablet Take 1 tablet (50 mg total) by mouth every 6 (six) hours as needed. 07/08/21 07/08/22 Yes Natajah Derderian, [place], MD  amLODipine (NORVASC) 5 MG tablet Take 1 tablet (5 mg total) by mouth daily. Patient not taking: Reported on 01/31/2018 01/10/18   Milagros Loll, MD  apixaban (ELIQUIS) 5 MG TABS tablet Take 5 mg by mouth 2 (two) times daily. 03/21/16   [provider]  furosemide (LASIX) 40 MG tablet Take 1 tablet (40 mg total) by mouth daily. 02/07/18   Midge Minium, MD  nadolol (CORGARD) 40 MG tablet  Take 1 tablet (40 mg total) by mouth daily. 01/31/18   Midge Minium, MD  pantoprazole (PROTONIX) 40 MG tablet Take 1 tablet by mouth daily. 05/15/18   [provider]  potassium chloride 20 MEQ TBCR Take 20 mEq by mouth daily. Patient taking differently: Take 20 mEq by mouth 2 (two) times daily.  01/10/18   Milagros Loll, MD  salicylic acid 17 % gel Apply topically daily. 12/27/19   Joni Reining, PA-C  spironolactone (ALDACTONE) 100 MG tablet Take 1 tablet (100 mg total) by mouth daily. 02/07/18   Midge Minium, MD    Allergies Diphenhydramine hcl  History reviewed. No pertinent family history.  Social History Social History   Tobacco Use   Smoking status: Never   Smokeless tobacco: Never  Vaping Use   Vaping Use: Never used  Substance Use Topics   Alcohol use: No   Drug use: No    Review of Systems  Constitutional: Negative for fever. Eyes: Negative for visual changes. ENT: Negative for sore throat. Neck: No neck pain  Cardiovascular: Negative for chest pain. Respiratory: Negative for shortness of breath. Gastrointestinal: Negative for abdominal pain, vomiting or diarrhea. Genitourinary: Negative for dysuria. Musculoskeletal: + back pain and RLE pain Skin: Negative for rash. Neurological: Negative for headaches, weakness or numbness. Psych: No SI or HI  ____________________________________________   PHYSICAL EXAM:  VITAL SIGNS: ED Triage Vitals  Enc Vitals Group     BP 07/08/21 0606 (!) 134/92     Pulse Rate 07/08/21 0606 75     Resp 07/08/21 0606 16     Temp 07/08/21 0606 97.8 F (36.6 C)     Temp Source 07/08/21 0606 Oral     SpO2 07/08/21 0606 100 %     Weight 07/08/21 0604 176 lb (79.8 kg)     Height 07/08/21 0604 5\' 9"  (1.753 m)     Head Circumference --      Peak Flow --      Pain Score 07/08/21 0604 9     Pain Loc --      Pain Edu? --      Excl. in GC? --     Constitutional: Alert and oriented. Well appearing and in no apparent  distress. HEENT:      Head: Normocephalic and atraumatic.         Eyes: Conjunctivae are normal. Sclera is non-icteric.       Mouth/Throat: Mucous membranes are moist.       Neck: Supple with no signs of meningismus. Cardiovascular: Regular rate and rhythm. No murmurs, gallops, or rubs. 2+ symmetrical distal pulses are present in all extremities. No JVD. Respiratory: Normal respiratory effort. Lungs are clear to auscultation bilaterally.  Gastrointestinal: Soft, non tender, and non distended. Musculoskeletal:  No midline spine tenderness. Patient is tender to palpation  over the sciatica notch.  Full painless range of motion of all extremities. Neurologic: Normal speech and language. Face is symmetric.  Normal gait, 1+ patellar DTR bilaterally, intact strength and sensation of bilateral lower extremities  skin: Skin is warm, dry and intact. No rash noted. Psychiatric: Mood and affect are normal. Speech and behavior are normal.  ____________________________________________   LABS (all labs ordered are listed, but only abnormal results are displayed)  Labs Reviewed - No data to display ____________________________________________  EKG  none  ____________________________________________  RADIOLOGY  I have personally reviewed the images performed during this visit and I agree with the Radiologist's read.   Interpretation by Radiologist:  DG Lumbar Spine Complete  Result Date: 07/08/2021 CLINICAL DATA:  Low back pain EXAM: LUMBAR SPINE - COMPLETE 4+ VIEW COMPARISON:  None. FINDINGS: There is no evidence of lumbar spine fracture. Alignment is normal. Intervertebral disc spaces are maintained. Tips shunt identified within the right upper quadrant of the abdomen. Moderate stool burden noted throughout the colon and rectum. IMPRESSION: 1. No acute findings. 2. Moderate stool burden throughout the colon and rectum compatible with constipation. Electronically Signed   By: Signa Kell M.D.    On: 07/08/2021 07:01     ____________________________________________   PROCEDURES  Procedure(s) performed: None Procedures   Critical Care performed:  None ____________________________________________   INITIAL IMPRESSION / ASSESSMENT AND PLAN / ED COURSE  55 y.o. 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  Patient with pain that starts in the upper part of the right buttock and radiate as a shocklike pain down the right leg in the posterior aspect.  Pain started after patient was standing for prolonged period of time washing cars 6 days ago.  Patient is extremely well-appearing in no distress with normal vital signs.  He has no midline or paraspinal tenderness, no signs of trauma.  He is tender to palpation over the right sciatic notch.  There is no signs of cellulitis in his leg, all joints have full painless range of motion, there is no signs of cauda equina, there is no signs of limb ischemia with normal well-perfused lower extremity with strong distal pulses.  There is no asymmetric swelling.  Patient has never had a breakthrough clot since being on Eliquis.  He denies missing any doses.  The pain is very positional and worse when he moves around and better when he is just sitting down.  This is most likely sciatica pain versus MSK pain.  We will get an x-ray to rule out a compression fracture or disc disease.  We will start patient on a Medrol pack and give him a short course of tramadol for pain.  Will refer to orthopedics for further evaluation.  Recommended return to the emergency room for fever, worsening pain, lower extremity weakness or numbness, urinary or bowel incontinence or retention, or saddle anesthesia.  Old medical records reviewed.  _________________________ 7:05 AM on 07/08/2021 ----------------------------------------- X-ray with no abnormalities.  Will  discharge patient home with follow-up with orthopedics.  Discussed my standard return precautions.      _____________________________________________ Please note:  Patient was evaluated in Emergency Department today for the symptoms described in the history of present illness. Patient was evaluated in the context of the global COVID-19 pandemic, which necessitated consideration that the patient might be at risk for infection with the SARS-CoV-2 virus that causes COVID-19.  Institutional protocols and algorithms that pertain to the evaluation of patients at risk for COVID-19 are in a state of rapid change based on information released by regulatory bodies including the CDC and federal and state organizations. These policies and algorithms were followed during the patient's care in the ED.  Some ED evaluations and interventions may be delayed as a result of limited staffing during the pandemic.   [place] Controlled Substance Database was reviewed by me. ____________________________________________   FINAL CLINICAL IMPRESSION(S) / ED DIAGNOSES   Final diagnoses:  Sciatica of right side      NEW MEDICATIONS STARTED DURING THIS VISIT:  ED Discharge Orders          Ordered    methylPREDNISolone (MEDROL DOSEPAK) 4 MG TBPK tablet        07/08/21 0627    acetaminophen (TYLENOL) 500 MG tablet  Every 8 hours PRN        07/08/21 0627    traMADol (ULTRAM) 50 MG tablet  Every 6 hours PRN        07/08/21 8466             Note:  This document was prepared using Dragon voice recognition software and may include unintentional dictation errors.    Don Perking, [place], MD 07/08/21 [phone]

## 2021-07-08 NOTE — ED Triage Notes (Addendum)
Pt arrived via POV, ambulatory c/o low back pain radiating down R leg. Pt states the pain is more in his hamstring on the R side. Pt states the pain started Monday, pt c/o pain while getting out of bed this morning.   Taking tylenol and using muscle rub for pain relief.  Pt takes eliquis daily, hx of PE

## 2021-07-08 NOTE — ED Notes (Signed)
Patient transported to X-ray 

## 2022-12-17 ENCOUNTER — Other Ambulatory Visit: Payer: Self-pay

## 2022-12-17 ENCOUNTER — Emergency Department
Admission: EM | Admit: 2022-12-17 | Discharge: 2022-12-17 | Disposition: A | Discharge status: Home / Self Care | Payer: Managed Care, Other (non HMO) | Attending: Student in an Organized Health Care Education/Training Program | Admitting: Student in an Organized Health Care Education/Training Program

## 2022-12-17 ENCOUNTER — Emergency Department: Payer: Managed Care, Other (non HMO)

## 2022-12-17 DIAGNOSIS — X500XXA Overexertion from strenuous movement or load, initial encounter: Secondary | ICD-10-CM | POA: Insufficient documentation

## 2022-12-17 DIAGNOSIS — S46912A Strain of unspecified muscle, fascia and tendon at shoulder and upper arm level, left arm, initial encounter: Secondary | ICD-10-CM | POA: Diagnosis not present

## 2022-12-17 DIAGNOSIS — S4992XA Unspecified injury of left shoulder and upper arm, initial encounter: Secondary | ICD-10-CM | POA: Diagnosis present

## 2022-12-17 LAB — COMPREHENSIVE METABOLIC PANEL
ALT: 24 U/L (ref 0–44)
AST: 27 U/L (ref 15–41)
Albumin: 3.7 g/dL (ref 3.5–5.0)
Alkaline Phosphatase: 109 U/L (ref 38–126)
Anion gap: 6 (ref 5–15)
BUN: 30 mg/dL — ABNORMAL HIGH (ref 6–20)
CO2: 22 mmol/L (ref 22–32)
Calcium: 8.6 mg/dL — ABNORMAL LOW (ref 8.9–10.3)
Chloride: 109 mmol/L (ref 98–111)
Creatinine, Ser: 1.59 mg/dL — ABNORMAL HIGH (ref 0.61–1.24)
GFR, Estimated: 50 mL/min — ABNORMAL LOW (ref >60)
Glucose, Bld: 112 mg/dL — ABNORMAL HIGH (ref 70–99)
Potassium: 3.9 mmol/L (ref 3.5–5.1)
Sodium: 137 mmol/L (ref 135–145)
Total Bilirubin: 1 mg/dL (ref 0.3–1.2)
Total Protein: 6.7 g/dL (ref 6.5–8.1)

## 2022-12-17 LAB — CBC WITH DIFFERENTIAL/PLATELET
Abs Immature Granulocytes: 0.01 10*3/uL (ref 0.00–0.07)
Basophils Absolute: 0 10*3/uL (ref 0.0–0.1)
Basophils Relative: 0 %
Eosinophils Absolute: 0.3 10*3/uL (ref 0.0–0.5)
Eosinophils Relative: 5 %
HCT: 38.1 % — ABNORMAL LOW (ref 39.0–52.0)
Hemoglobin: 12.5 g/dL — ABNORMAL LOW (ref 13.0–17.0)
Immature Granulocytes: 0 %
Lymphocytes Relative: 25 %
Lymphs Abs: 1.4 10*3/uL (ref 0.7–4.0)
MCH: 30.2 pg (ref 26.0–34.0)
MCHC: 32.8 g/dL (ref 30.0–36.0)
MCV: 92 fL (ref 80.0–100.0)
Monocytes Absolute: 0.4 10*3/uL (ref 0.1–1.0)
Monocytes Relative: 7 %
Neutro Abs: 3.6 10*3/uL (ref 1.7–7.7)
Neutrophils Relative %: 63 %
Platelets: 131 10*3/uL — ABNORMAL LOW (ref 150–400)
RBC: 4.14 MIL/uL — ABNORMAL LOW (ref 4.22–5.81)
RDW: 14.3 % (ref 11.5–15.5)
WBC: 5.6 10*3/uL (ref 4.0–10.5)
nRBC: 0 % (ref 0.0–0.2)

## 2022-12-17 LAB — TROPONIN I (HIGH SENSITIVITY): Troponin I (High Sensitivity): 5 ng/L (ref <18)

## 2022-12-17 MED ORDER — CYCLOBENZAPRINE HCL 10 MG PO TABS: 10.0000 mg | ORAL_TABLET | Freq: Three times a day (TID) | ORAL | 0 refills | Status: DC | PRN

## 2022-12-17 MED ORDER — OXYCODONE-ACETAMINOPHEN 5-325 MG PO TABS: 1.0000 | ORAL_TABLET | Freq: Once | ORAL | Status: DC

## 2022-12-17 MED ORDER — LIDOCAINE 5 % EX PTCH: 1.0000 | MEDICATED_PATCH | Freq: Two times a day (BID) | CUTANEOUS | 0 refills | Status: DC

## 2022-12-17 MED ORDER — CYCLOBENZAPRINE HCL 10 MG PO TABS
5.0000 mg | ORAL_TABLET | Freq: Once | ORAL | Status: AC
  Administered 2022-12-17: 5 mg via ORAL
  Filled 2022-12-17: qty 1

## 2022-12-17 MED ORDER — LIDOCAINE 5 % EX PTCH
1.0000 | MEDICATED_PATCH | CUTANEOUS | Status: DC
  Administered 2022-12-17: 1 via TRANSDERMAL
  Filled 2022-12-17: qty 1

## 2022-12-17 NOTE — ED Provider Notes (Signed)
[facility] Provider Note    Event Date/Time   First MD Initiated Contact with Patient 12/17/22 0425     (approximate)   History   Shoulder Pain   HPI  Christian Fuentes is a 57 y.o. male presents to the ER for evaluation of left shoulder pain for the past few days after he helped his family member move.  Feels like he strained a muscle in his left shoulder.  Denies any shortness of breath.  No pleuritic pain.  Has taken some Tylenol without much improvement.  Last dose of Tylenol was yesterday.  Denies any diaphoresis.  Denies any other specific injury.     Physical Exam   Triage Vital Signs: ED Triage Vitals [12/17/22 0423]  Enc Vitals Group     BP 128/84     Pulse Rate 70     Resp 18     Temp 98 F (36.7 C)     Temp Source Oral     SpO2 99 %     Weight 174 lb (78.9 kg)     Height '5\' 9"'$  (1.753 m)     Head Circumference      Peak Flow      Pain Score 8     Pain Loc      Pain Edu?      Excl. in [place]?     Most recent vital signs: Vitals:   12/17/22 0423 12/17/22 0454  BP: 128/84 133/85  Pulse: 70 (!) 57  Resp: 18 17  Temp: 98 F (36.7 C)   SpO2: 99% 100%     Constitutional: Alert  Eyes: Conjunctivae are normal.  Head: Atraumatic. Nose: No congestion/rhinnorhea. Mouth/Throat: Mucous membranes are moist.   Neck: Painless ROM.  Cardiovascular:   Good peripheral circulation. Respiratory: Normal respiratory effort.  No retractions.  Gastrointestinal: Soft and nontender.  Musculoskeletal:  no deformity Neurologic:  MAE spontaneously. No gross focal neurologic deficits are appreciated.  Skin:  Skin is warm, dry and intact. No rash noted. Psychiatric: Mood and affect are normal. Speech and behavior are normal.    ED Results / Procedures / Treatments   Labs (all labs ordered are listed, but only abnormal results are displayed) Labs Reviewed  CBC WITH DIFFERENTIAL/PLATELET - Abnormal; Notable for the following components:       Result Value   RBC 4.14 (*)    Hemoglobin 12.5 (*)    HCT 38.1 (*)    Platelets 131 (*)    All other components within normal limits  COMPREHENSIVE METABOLIC PANEL - Abnormal; Notable for the following components:   Glucose, Bld 112 (*)    BUN 30 (*)    Creatinine, Ser 1.59 (*)    Calcium 8.6 (*)    GFR, Estimated 50 (*)    All other components within normal limits  TROPONIN I (HIGH SENSITIVITY)  TROPONIN I (HIGH SENSITIVITY)     EKG  ED ECG REPORT I, Merlyn Lot, the attending physician, personally viewed and interpreted this ECG.   Date: 12/17/2022  EKG Time: 4:53  Rate: 60  Rhythm: sinus  Axis: normal  Intervals: normal  ST&T Change: no stemi, no depressions    RADIOLOGY Please see ED Course for my review and interpretation.  I personally reviewed all radiographic images ordered to evaluate for the above acute complaints and reviewed radiology reports and findings.  These findings were personally discussed with the patient.  Please see medical record for radiology report.  PROCEDURES:  Critical Care performed:   Procedures   MEDICATIONS ORDERED IN ED: Medications  lidocaine (LIDODERM) 5 % 1 patch (1 patch Transdermal Patch Applied 12/17/22 0544)  cyclobenzaprine (FLEXERIL) tablet 5 mg (5 mg Oral Given 12/17/22 0544)     IMPRESSION / MDM / ASSESSMENT AND PLAN / ED COURSE  I reviewed the triage vital signs and the nursing notes.                              Differential diagnosis includes, but is not limited to, muscle strain, fracture, contusion, ACS, dissection, PE, pleurisy, pneumonia  Patient presenting to the ER for evaluation of symptoms as described above.  Based on symptoms, risk factors and considered above differential, this presenting complaint could reflect a potentially life-threatening illness therefore the patient will be placed on continuous pulse oximetry and telemetry for monitoring.  Laboratory evaluation will be sent to evaluate for  the above complaints.  Patient's symptoms seem very musculoskeletal in nature.  Troponin negative.  EKG nonischemic.  Chest x-ray my review and interpretation without any evidence of fracture or contusion or pneumothorax.  He is on anticoagulation and reports compliance with his medications.  This not consistent with dissection or abdominal pathology.  No sign of infectious process.  Do think he is appropriate for outpatient treatment with supportive care.  Do not feel that further diagnostic testing clinically indicated.      FINAL CLINICAL IMPRESSION(S) / ED DIAGNOSES   Final diagnoses:  Strain of left shoulder, initial encounter     Rx / DC Orders   ED Discharge Orders          Ordered    cyclobenzaprine (FLEXERIL) 10 MG tablet  3 times daily PRN        12/17/22 0540    lidocaine (LIDODERM) 5 %  Every 12 hours        12/17/22 0542             Note:  This document was prepared using Dragon voice recognition software and may include unintentional dictation errors.    Merlyn Lot, MD 12/17/22 [phone]

## 2022-12-17 NOTE — ED Triage Notes (Signed)
L shoulder pain x 2 days after moving furniture. Denies pain elsewhere. Denies fall. ROM intact and pt denies numbness or tingling to arm. Pt ambulatory to triage. Alert and oriented following commands. Breathing unlabored with symmetric chest rise/fall

## 2022-12-25 ENCOUNTER — Encounter (HOSPITAL_BASED_OUTPATIENT_CLINIC_OR_DEPARTMENT_OTHER): Payer: Self-pay

## 2022-12-25 ENCOUNTER — Emergency Department (HOSPITAL_BASED_OUTPATIENT_CLINIC_OR_DEPARTMENT_OTHER): Payer: Managed Care, Other (non HMO)

## 2022-12-25 ENCOUNTER — Emergency Department (HOSPITAL_BASED_OUTPATIENT_CLINIC_OR_DEPARTMENT_OTHER)
Admission: EM | Admit: 2022-12-25 | Discharge: 2022-12-25 | Disposition: A | Discharge status: Home / Self Care | Payer: No Typology Code available for payment source | Attending: Emergency Medicine | Admitting: Emergency Medicine

## 2022-12-25 ENCOUNTER — Other Ambulatory Visit: Payer: Self-pay

## 2022-12-25 ENCOUNTER — Other Ambulatory Visit (HOSPITAL_BASED_OUTPATIENT_CLINIC_OR_DEPARTMENT_OTHER): Payer: Self-pay

## 2022-12-25 DIAGNOSIS — S0181XA Laceration without foreign body of other part of head, initial encounter: Secondary | ICD-10-CM | POA: Insufficient documentation

## 2022-12-25 DIAGNOSIS — W01198A Fall on same level from slipping, tripping and stumbling with subsequent striking against other object, initial encounter: Secondary | ICD-10-CM | POA: Diagnosis not present

## 2022-12-25 DIAGNOSIS — W19XXXA Unspecified fall, initial encounter: Secondary | ICD-10-CM

## 2022-12-25 DIAGNOSIS — Z7901 Long term (current) use of anticoagulants: Secondary | ICD-10-CM | POA: Diagnosis not present

## 2022-12-25 MED ORDER — LIDOCAINE-EPINEPHRINE-TETRACAINE (LET) TOPICAL GEL
3.0000 mL | Freq: Once | TOPICAL | Status: AC
  Administered 2022-12-25: 3 mL via TOPICAL
  Filled 2022-12-25: qty 3

## 2022-12-25 NOTE — ED Notes (Signed)
Pt sitting up in bed, pt denies pain, bacitracin placed per md instructions, pt states that he is ready to go home.

## 2022-12-25 NOTE — ED Notes (Signed)
Md at bedside for lac repair  °

## 2022-12-25 NOTE — ED Provider Notes (Signed)
[facility] Provider Note   CSN: [id number] Arrival date & time: 12/25/22  1113     History  Chief Complaint  Patient presents with   Christian Fuentes is a 57 y.o. male.   Fall     57 year old male with medical history significant for PE, factor V Leiden on Eliquis chronically, cirrhosis who presents to the emergency department after a mechanical fall.  The patient states that he tripped over concrete and landed on pavement on his chin.  He denies any loss of consciousness.  He did take his Eliquis this morning.  His tetanus is up-to-date as of the past few months.  He states that he sustained a 2-3 cm laceration to his chin.  Bleeding was controlled.  He arrived to the emergency department GCS 15, ABC intact.  On arrival, he denied any other pain or complaints.  Home Medications Prior to Admission medications   Medication Sig Start Date End Date Taking? Authorizing Provider  amLODipine (NORVASC) 5 MG tablet Take 1 tablet (5 mg total) by mouth daily. Patient not taking: Reported on 01/31/2018 01/10/18   Hillary Bow, MD  apixaban (ELIQUIS) 5 MG TABS tablet Take 5 mg by mouth 2 (two) times daily. 03/21/16   [provider]  cyclobenzaprine (FLEXERIL) 10 MG tablet Take 1 tablet (10 mg total) by mouth 3 (three) times daily as needed for muscle spasms. 12/17/22   Merlyn Lot, MD  furosemide (LASIX) 40 MG tablet Take 1 tablet (40 mg total) by mouth daily. 02/07/18   Lucilla Lame, MD  lidocaine (LIDODERM) 5 % Place 1 patch onto the skin every 12 (twelve) hours. Remove & Discard patch within 12 hours or as directed by MD 12/17/22 12/17/23  Merlyn Lot, MD  methylPREDNISolone (MEDROL DOSEPAK) 4 MG TBPK tablet Day 1: 24 mg on day 1 administered as 8 mg (2 tablets) before breakfast, 4 mg (1 tablet) after lunch, 4 mg (1 tablet) after supper, and 8 mg (2 tablets) at bedtime.  Day 2: 20 mg on day 2 administered as 4 mg (1 tablet)  before breakfast, 4 mg (1 tablet) after lunch, 4 mg (1 tablet) after supper, and 8 mg (2 tablets) at bedtime.  Day 3: 16 mg on day 3 administered as 4 mg (1 tablet) before breakfast, 4 mg (1 tablet) after lunch, 4 mg (1 tablet) after supper, and 4 mg (1 tablet) at bedtime.  Day 4: 12 mg on day 4 administered as 4 mg (1 tablet) before breakfast, 4 mg (1 tablet) after lunch, and 4 mg (1 tablet) at bedtime.  Day 5: 8 mg on day 5 administered as 4 mg (1 tablet) before breakfast and 4 mg (1 tablet) at bedtime.  Day 6: 4 mg on day 6 administered as 4 mg (1 tablet) before breakfast. 07/08/21   Alfred Levins, [place], MD  nadolol (CORGARD) 40 MG tablet Take 1 tablet (40 mg total) by mouth daily. 01/31/18   Lucilla Lame, MD  pantoprazole (PROTONIX) 40 MG tablet Take 1 tablet by mouth daily. 05/15/18   [provider]  potassium chloride 20 MEQ TBCR Take 20 mEq by mouth daily. Patient taking differently: Take 20 mEq by mouth 2 (two) times daily.  01/10/18   Hillary Bow, MD  salicylic acid 17 % gel Apply topically daily. 12/27/19   Sable Feil, PA-C  spironolactone (ALDACTONE) 100 MG tablet Take 1 tablet (100 mg total) by mouth daily. 02/07/18   Wohl,  Darren, MD      Allergies    Diphenhydramine hcl    Review of Systems   Review of Systems  All other systems reviewed and are negative.   Physical Exam Updated Vital Signs BP (!) 153/85 (BP Location: Right Arm)   Pulse 87   Temp (!) 97.1 F (36.2 C) (Temporal)   Resp 18   Ht '5\' 9"'$  (1.753 m)   Wt 68 kg   SpO2 100%   BMI 22.15 kg/m  Physical Exam Vitals and nursing note reviewed.  Constitutional:      General: He is not in acute distress.    Appearance: He is well-developed.     Comments: GCS 15, ABC intact  HENT:     Head: Normocephalic.     Comments: Small 2 cm laceration about the chin, hemostatic Eyes:     Conjunctiva/sclera: Conjunctivae normal.  Neck:     Comments: No midline tenderness to palpation of the cervical spine.  ROM intact. Cardiovascular:     Rate and Rhythm: Normal rate and regular rhythm.     Heart sounds: No murmur heard. Pulmonary:     Effort: Pulmonary effort is normal. No respiratory distress.     Breath sounds: Normal breath sounds.  Chest:     Comments: Chest wall stable and non-tender to AP and lateral compression. Clavicles stable and non-tender to AP compression Abdominal:     Palpations: Abdomen is soft.     Tenderness: There is no abdominal tenderness.     Comments: Pelvis stable to lateral compression.  Musculoskeletal:        General: No swelling.     Cervical back: Neck supple.     Comments: No midline tenderness to palpation of the thoracic or lumbar spine. Extremities atraumatic with intact ROM.   Skin:    General: Skin is warm and dry.     Capillary Refill: Capillary refill takes less than 2 seconds.  Neurological:     General: No focal deficit present.     Mental Status: He is alert and oriented to person, place, and time.     Cranial Nerves: No cranial nerve deficit.     Sensory: No sensory deficit.     Motor: No weakness.     Comments: CN II-XII grossly intact. Moving all four extremities spontaneously and sensation grossly intact.  Psychiatric:        Mood and Affect: Mood normal.     ED Results / Procedures / Treatments   Labs (all labs ordered are listed, but only abnormal results are displayed) Labs Reviewed - No data to display  EKG None  Radiology CT Head Wo Contrast  Result Date: 12/25/2022 CLINICAL DATA:  Head trauma, moderate-severe. [place] to the ground. Anticoagulated. EXAM: CT HEAD WITHOUT CONTRAST TECHNIQUE: Contiguous axial images were obtained from the base of the skull through the vertex without intravenous contrast. RADIATION DOSE REDUCTION: This exam was performed according to the departmental dose-optimization program which includes automated exposure control, adjustment of the mA and/or kV according to patient size and/or use of iterative  reconstruction technique. COMPARISON:  None Available. FINDINGS: Brain: The brain parenchyma appears normal without evidence of old or acute infarction, mass lesion, intraparenchymal hemorrhage, hydrocephalus or definite subdural hematoma. No subarachnoid blood is seen. There is minimal asymmetric thickening of the left leaflet of the tentorium compared to the right and there could be a very tiny amount of subdural blood. If this is present, it is only 1 mm thick. Vascular:  No abnormal vascular finding. Skull: Negative Sinuses/Orbits: Clear/normal Other: None IMPRESSION: Minimal asymmetric thickening of the left leaflet of the tentorium compared to the right. This could represent a very minimal bit of subdural bleeding. If this is present, it is only 1 mm thick. No other intracranial finding. These results were called by telephone at the time of interpretation on 12/25/2022 at 1:14 pm to provider [facility] , who verbally acknowledged these results. Electronically Signed   By: Nelson Chimes M.D.   On: 12/25/2022 13:14    Procedures .Marland KitchenLaceration Repair  Date/Time: 12/25/2022 2:21 PM  Performed by: Regan Lemming, MD Authorized by: Regan Lemming, MD   Consent:    Consent obtained:  Verbal   Consent given by:  Patient   Risks discussed:  Infection, pain and poor cosmetic result Universal protocol:    Patient identity confirmed:  Verbally with patient Anesthesia:    Anesthesia method:  Topical application   Topical anesthetic:  LET Laceration details:    Location:  Face   Face location:  Chin   Length (cm):  2   Depth (mm):  3 Treatment:    Area cleansed with:  Saline   Amount of cleaning:  Standard Skin repair:    Repair method:  Sutures   Suture size:  5-0   Wound skin closure material used: Vicryl Rapide.   Number of sutures:  4 Approximation:    Approximation:  Close Repair type:    Repair type:  Simple Post-procedure details:    Dressing:  Open (no dressing)   Procedure  completion:  Tolerated     Medications Ordered in ED Medications  lidocaine-EPINEPHrine-tetracaine (LET) topical gel (3 mLs Topical Given 12/25/22 1246)    ED Course/ Medical Decision Making/ A&P                             Medical Decision Making Amount and/or Complexity of Data Reviewed Radiology: ordered.    57 year old male with medical history significant for PE, factor V Leiden on Eliquis chronically, cirrhosis who presents to the emergency department after a mechanical fall.  The patient states that he tripped over concrete and landed on pavement on his chin.  He denies any loss of consciousness.  He did take his Eliquis this morning.  His tetanus is up-to-date as of the past few months.  He states that he sustained a 2-3 cm laceration to his chin.  Bleeding was controlled.  He arrived to the emergency department GCS 15, ABC intact.  On arrival, he denied any other pain or complaints.  On arrival, the patient was vitally stable, afebrile, temperature 97.1, not tachycardic or tachypneic, BP 153/85, saturating 100% on room air.  The patient presented with a normal neurologic exam.  CT imaging the head was performed and results indicated a possible trace subdural, per radiology this would not have been called but was identified via AI software.  Did consult neurosurgery, Dr. Sherley Bounds who felt the patient warranted no observation and no repeat head CT, return precautions provided as needed, stable for discharge.  The patient's tetanus is up-to-date.  His wound on his chin was irrigated and subsequently repaired with 5-0 Vicryl Rapide sutures.  He was provided with wound care instructions.  Strict return precautions in the event of headache, signs and symptoms of worsening subdural.  The patient was advised to hold his Eliquis tonight and resume tomorrow.   Final Clinical Impression(s) / ED Diagnoses  Final diagnoses:  Chin laceration, initial encounter  Fall, initial encounter     Rx / DC Orders ED Discharge Orders     None         Regan Lemming, MD 12/25/22 1423

## 2022-12-25 NOTE — ED Triage Notes (Signed)
Patient here POV from Home.  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 Fell onto the [employer]. Sustained Injury to Chin. No Loose Teeth.   No LOC. Takes Eliquis. 2-3 cm Laceration to Chin with Bleeding controlled.   NAD Noted during Triage. A&Ox4. GCS 15. Ambulatory.

## 2022-12-25 NOTE — ED Notes (Signed)
Pt in bed, pt states that he fell this am, pt awake and oriented, pt answering questions appropriately, pupils are 49m and reactive, pt has aprox 1 inch lac to his chin, bleeding is stopped at this time, pt denies any other pain, denies loc.

## 2022-12-25 NOTE — Discharge Instructions (Addendum)
Your chin laceration was repaired with 4 absorbable sutures.  The sutures will be absorbed into the tissue and will fall out over time.  Watch for signs of developing infection which include redness, worsening pain and swelling, purulent discharge.  Your CT head revealed a possible trace amount of bleeding in your brain.  Recommend you hold the dose of Eliquis tonight and resume tomorrow.  Neurosurgery evaluated your CT scan and was unconcerned.  If for any reason you develop headache, blurry vision, nausea and vomiting, unsteadiness on your feet, return to the emergency department immediately for stat CT imaging of the head.

## 2023-01-02 ENCOUNTER — Other Ambulatory Visit: Payer: Self-pay

## 2023-01-02 ENCOUNTER — Emergency Department
Admission: EM | Admit: 2023-01-02 | Discharge: 2023-01-02 | Disposition: A | Discharge status: Home / Self Care | Payer: Managed Care, Other (non HMO) | Attending: Emergency Medicine | Admitting: Emergency Medicine

## 2023-01-02 ENCOUNTER — Encounter: Payer: Self-pay | Admitting: Intensive Care

## 2023-01-02 DIAGNOSIS — M79605 Pain in left leg: Secondary | ICD-10-CM | POA: Diagnosis present

## 2023-01-02 DIAGNOSIS — M5432 Sciatica, left side: Secondary | ICD-10-CM

## 2023-01-02 HISTORY — DX: Sarcoidosis, unspecified: D86.9

## 2023-01-02 MED ORDER — KETOROLAC TROMETHAMINE 30 MG/ML IJ SOLN: 30.0000 mg | Freq: Once | INTRAMUSCULAR | Status: DC

## 2023-01-02 MED ORDER — PREDNISONE 10 MG PO TABS: ORAL_TABLET | ORAL | 0 refills | Status: DC

## 2023-01-02 MED ORDER — DEXAMETHASONE SODIUM PHOSPHATE 10 MG/ML IJ SOLN
10.0000 mg | Freq: Once | INTRAMUSCULAR | Status: AC
  Administered 2023-01-02: 10 mg via INTRAMUSCULAR
  Filled 2023-01-02: qty 1

## 2023-01-02 NOTE — Discharge Instructions (Signed)
Follow-up with your primary care provider if any continued problems or concerns.  A prescription for prednisone was sent to the pharmacy to begin taking a tapering dose starting with 4 tablets and decreasing by 1 tablet every 2 days.  You may use ice or heat to your back as needed for discomfort and also elevate your leg with pillows under your knees when you are lying in the bed flat which will help with some of your pain.

## 2023-01-02 NOTE — ED Provider Notes (Signed)
[facility] Provider Note    Event Date/Time   First MD Initiated Contact with Patient 01/02/23 [phone]     (approximate)   History   Leg Injury   HPI  Christian Fuentes is a 57 y.o. male presents to the ED with complaint of radiation into his left leg from his back that started yesterday.  Patient denies any injury prior to this.  Patient has not taken any over-the-counter medication for this.  He describes this as a tingling sensation from his buttocks down into his leg.  No incontinence of bowel or bladder or saddle anesthesias.     Physical Exam   Triage Vital Signs: ED Triage Vitals [01/02/23 0833]  Enc Vitals Group     BP 119/70     Pulse Rate 79     Resp 16     Temp 97.6 F (36.4 C)     Temp Source Oral     SpO2 95 %     Weight 167 lb (75.8 kg)     Height 5\' 9"  (1.753 m)     Head Circumference      Peak Flow      Pain Score 5     Pain Loc      Pain Edu?      Excl. in [place]?     Most recent vital signs: Vitals:   01/02/23 0833  BP: 119/70  Pulse: 79  Resp: 16  Temp: 97.6 F (36.4 C)  SpO2: 95%     General: Awake, no distress.  CV:  Good peripheral perfusion.  Resp:  Normal effort.  Abd:  No distention.  Other:  No tenderness or step-offs are noted on palpation of the lumbar spine.  There is moderate tenderness on palpation of the left SI joint area and surrounding tissue.  Good muscle strength bilaterally at 5/5, reflexes are 2+ bilaterally.  Straight leg raises are negative.  Patient is able to stand and ambulate without any assistance.   ED Results / Procedures / Treatments   Labs (all labs ordered are listed, but only abnormal results are displayed) Labs Reviewed - No data to display    PROCEDURES:  Critical Care performed:   Procedures   MEDICATIONS ORDERED IN ED: Medications  dexamethasone (DECADRON) injection 10 mg (10 mg Intramuscular Given 01/02/23 0907)     IMPRESSION / MDM / [place] / ED  COURSE  I reviewed the triage vital signs and the nursing notes.   Differential diagnosis includes, but is not limited to, left leg radiculopathy, muscle skeletal pain.  57 year old male presents to the ED with complaint of left leg radiculopathy without history of recent injury or prior back problems.  Patient has not taken any over-the-counter medication.  No symptoms suggestive of cauda equina.  Patient is ambulatory without any assistance with a normal gait.  He was given Decadron 10 mg IM while in the ED and a taper dose of prednisone.  He is encouraged to use ice or heat to his lower back and to use pillows under his knees to elevate his legs when lying in bed.  He is to follow-up with his PCP at [facility] if any continued problems.      Patient's presentation is most consistent with acute, uncomplicated illness.  FINAL CLINICAL IMPRESSION(S) / ED DIAGNOSES   Final diagnoses:  Sciatica of left side     Rx / DC Orders   ED Discharge Orders  Ordered    predniSONE (DELTASONE) 10 MG tablet        01/02/23 [id number]             Note:  This document was prepared using Dragon voice recognition software and may include unintentional dictation errors.   Johnn Hai, PA-C 01/02/23 [id number]    Vanessa , MD 01/06/23 [phone]

## 2023-01-02 NOTE — ED Triage Notes (Signed)
Patient presents with left leg pain. Starts in buttocks and radiates down leg. Started yesterday

## 2023-05-05 ENCOUNTER — Emergency Department: Payer: Managed Care, Other (non HMO)

## 2023-05-05 ENCOUNTER — Encounter: Payer: Self-pay | Admitting: Emergency Medicine

## 2023-05-05 ENCOUNTER — Other Ambulatory Visit: Payer: Self-pay

## 2023-05-05 ENCOUNTER — Emergency Department
Admission: EM | Admit: 2023-05-05 | Discharge: 2023-05-05 | Disposition: A | Discharge status: Home / Self Care | Payer: Managed Care, Other (non HMO) | Attending: Emergency Medicine | Admitting: Emergency Medicine

## 2023-05-05 DIAGNOSIS — S6991XA Unspecified injury of right wrist, hand and finger(s), initial encounter: Secondary | ICD-10-CM | POA: Diagnosis present

## 2023-05-05 DIAGNOSIS — I129 Hypertensive chronic kidney disease with stage 1 through stage 4 chronic kidney disease, or unspecified chronic kidney disease: Secondary | ICD-10-CM | POA: Diagnosis not present

## 2023-05-05 DIAGNOSIS — S52031A Displaced fracture of olecranon process with intraarticular extension of right ulna, initial encounter for closed fracture: Secondary | ICD-10-CM | POA: Insufficient documentation

## 2023-05-05 DIAGNOSIS — N189 Chronic kidney disease, unspecified: Secondary | ICD-10-CM | POA: Diagnosis not present

## 2023-05-05 DIAGNOSIS — X58XXXA Exposure to other specified factors, initial encounter: Secondary | ICD-10-CM | POA: Diagnosis not present

## 2023-05-05 DIAGNOSIS — S52021A Displaced fracture of olecranon process without intraarticular extension of right ulna, initial encounter for closed fracture: Secondary | ICD-10-CM

## 2023-05-05 MED ORDER — TRAMADOL HCL 50 MG PO TABS: 50.0000 mg | ORAL_TABLET | Freq: Four times a day (QID) | ORAL | 0 refills | Status: DC | PRN

## 2023-05-05 NOTE — ED Triage Notes (Signed)
Pt ambulatory to triage without difficulty c/o right arm pain from elbow to fingers.  States he washed his car yesterday and the pain started after that.  Denies known injury.

## 2023-05-05 NOTE — ED Provider Notes (Signed)
[facility] Provider Note    Event Date/Time   First MD Initiated Contact with Patient 05/05/23 [phone]     (approximate)   History   Arm Pain   HPI  Christian Fuentes is a 57 y.o. male  with history of cirrhosis, Budd-Chiari syndrome, HTN, CKD and as listed in EMR presents to the emergency department for treatment and evaluation of right arm pain that started after washing his car yesterday. No specific injury, but it hurts to fully extend his elbow. No similar symptoms in the past. Pain shoots down into his hand when he tries to rotate his wrist. No alleviating measures prior to arrival..      Physical Exam   Triage Vital Signs: ED Triage Vitals  Encounter Vitals Group     BP 05/05/23 0806 128/75     Systolic BP Percentile --      Diastolic BP Percentile --      Pulse Rate 05/05/23 0806 73     Resp 05/05/23 0806 18     Temp 05/05/23 0806 97.8 F (36.6 C)     Temp Source 05/05/23 0806 Oral     SpO2 05/05/23 0806 100 %     Weight 05/05/23 0820 170 lb (77.1 kg)     Height 05/05/23 0820 5\' 9"  (1.753 m)     Head Circumference --      Peak Flow --      Pain Score --      Pain Loc --      Pain Education --      Exclude from Growth Chart --     Most recent vital signs: Vitals:   05/05/23 0806 05/05/23 1207  BP: 128/75   Pulse: 73 72  Resp: 18 18  Temp: 97.8 F (36.6 C)   SpO2: 100% 100%    General: Awake, no distress.  CV:  Good peripheral perfusion.  Resp:  Normal effort.  Abd:  No distention.  Other:  Radial pulses 2+, equal grip strength. Holding right arm with slight flexion of the elbow.   ED Results / Procedures / Treatments   Labs (all labs ordered are listed, but only abnormal results are displayed) Labs Reviewed - No data to display   EKG  Not indicated.   RADIOLOGY  Image and radiology report reviewed and interpreted by me. Radiology report consistent with the same.  Possible fracture of the base of the  olecranon of the right elbow.  PROCEDURES:  Critical Care performed: No  Procedures   MEDICATIONS ORDERED IN ED:  Medications - No data to display   IMPRESSION / MDM / ASSESSMENT AND PLAN / ED COURSE   I have reviewed the triage note.  Differential diagnosis includes, but is not limited to,   Patient's presentation is most consistent with acute illness/injury requiring diagnostic workup  57 year old male presenting to the emergency department for treatment and evaluation of nontraumatic right arm pain.  See HPI for further details.  ' On exam, he does hold arm with slight flexion of the right elbow and complains of increased pain with attempt to straighten it.  Pain extends down into the index through ring finger.  Symptoms and exam most consistent with a possible radial head fracture although the patient denies any new injury.  Will get an x-ray to evaluate for radial head fracture or joint effusion.  X-ray shows possible olecranon fracture. Initially patient had denied recent injury, however he now recalls falling at work a  couple of months ago. He had x-rays of his right shoulder, but not elbow. He feels this must have happened during that fall.  Plan will be to have him rest and use ice over the area. Medications written and he will be discharged home.      FINAL CLINICAL IMPRESSION(S) / ED DIAGNOSES   Final diagnoses:  Closed fracture of olecranon process of right ulna, initial encounter     Rx / DC Orders   ED Discharge Orders          Ordered    traMADol (ULTRAM) 50 MG tablet  Every 6 hours PRN        05/05/23 1202             Note:  This document was prepared using Dragon voice recognition software and may include unintentional dictation errors.   Chinita Pester, FNP 05/05/23 1621    Shaune Pollack, MD 05/06/23 [phone]

## 2023-08-31 ENCOUNTER — Other Ambulatory Visit: Payer: Self-pay

## 2023-08-31 ENCOUNTER — Encounter: Payer: Self-pay | Admitting: Emergency Medicine

## 2023-08-31 ENCOUNTER — Emergency Department: Payer: Managed Care, Other (non HMO)

## 2023-08-31 ENCOUNTER — Emergency Department
Admission: EM | Admit: 2023-08-31 | Discharge: 2023-08-31 | Disposition: A | Discharge status: Home / Self Care | Payer: Managed Care, Other (non HMO) | Attending: Emergency Medicine | Admitting: Emergency Medicine

## 2023-08-31 DIAGNOSIS — M25511 Pain in right shoulder: Secondary | ICD-10-CM | POA: Diagnosis present

## 2023-08-31 DIAGNOSIS — Z7901 Long term (current) use of anticoagulants: Secondary | ICD-10-CM | POA: Diagnosis not present

## 2023-08-31 MED ORDER — LIDOCAINE 5 % EX PTCH: 1.0000 | MEDICATED_PATCH | Freq: Two times a day (BID) | CUTANEOUS | 0 refills | Status: AC

## 2023-08-31 NOTE — ED Provider Notes (Signed)
[facility] Provider Note    Event Date/Time   First MD Initiated Contact with Patient 08/31/23 [phone]     (approximate)   History   Shoulder Pain   HPI  Christian Fuentes is a 57 y.o. male with cirrhosis, followed at [facility], prior DVT on Eliquis who comes in with concern for right shoulder pain.  Patient reports about 2 weeks ago doing a lot of heavy lifting with helping his friend move.  He reports that since that he is been having right shoulder pain worse with movement.  Denies any chest pain, shortness of breath, falls or hitting his head or any other concerns.  He reports being compliant with his Eliquis.  Denies prior surgery on this shoulder.   Physical Exam   Triage Vital Signs: ED Triage Vitals  Encounter Vitals Group     BP 08/31/23 0630 133/77     Systolic BP Percentile --      Diastolic BP Percentile --      Pulse Rate 08/31/23 0630 68     Resp 08/31/23 0630 18     Temp 08/31/23 0630 97.8 F (36.6 C)     Temp src --      SpO2 08/31/23 0630 100 %     Weight 08/31/23 0627 172 lb (78 kg)     Height 08/31/23 0627 5\' 9"  (1.753 m)     Head Circumference --      Peak Flow --      Pain Score 08/31/23 0627 6     Pain Loc --      Pain Education --      Exclude from Growth Chart --     Most recent vital signs: Vitals:   08/31/23 0630  BP: 133/77  Pulse: 68  Resp: 18  Temp: 97.8 F (36.6 C)  SpO2: 100%     General: Awake, no distress.  CV:  Good peripheral perfusion.  Resp:  Normal effort.  Abd:  No distention.  Other:  Radial, ulnar, median nerve are all intact.  Sensation is intact.  Good distal pulse.  Range of motion is still intact although he does report some discomfort with movements.  No redness, no warmth noted   ED Results / Procedures / Treatments   Labs (all labs ordered are listed, but only abnormal results are displayed) Labs Reviewed - No data to display    RADIOLOGY I have reviewed the xray personally and  interpreted and patient has some degenerative spurring  PROCEDURES:  Critical Care performed: No  Procedures   MEDICATIONS ORDERED IN ED: Medications - No data to display   IMPRESSION / MDM / ASSESSMENT AND PLAN / ED COURSE  I reviewed the triage vital signs and the nursing notes.   Patient's presentation is most consistent with acute, uncomplicated illness.   Patient comes in with right shoulder pain.  This has been going on for 2 weeks he.  He reports worse at nighttime with an achiness.  On examination there is no evidence of infection good distal pulse unlikely DVT given no swelling and on blood thinner.  X-ray ordered without evidence of fracture but does have some degenerative spurring.  We discussed that this is most likely musculoskeletal or related to the spurring and he can follow-up with orthopedics.  Given his cirrhosis history we discussed limited Tylenol use (which she states he is aware of and does not take more than 2 pills a day.  ) Avoiding ibuprofen given  he is on blood thinner and lidocaine patches    FINAL CLINICAL IMPRESSION(S) / ED DIAGNOSES   Final diagnoses:  Acute pain of right shoulder     Rx / DC Orders   ED Discharge Orders          Ordered    lidocaine (LIDODERM) 5 %  Every 12 hours        08/31/23 0756             Note:  This document was prepared using Dragon voice recognition software and may include unintentional dictation errors.   Concha Se, MD 08/31/23 [phone]

## 2023-08-31 NOTE — ED Triage Notes (Signed)
Patient ambulatory to triage with steady gait, without difficulty or distress noted; pt c/o rt shoulder pain x 2wks with any ROM, no known injury, no hx of same

## 2023-08-31 NOTE — Discharge Instructions (Addendum)
Avoid ibuprofen, Motrin etc.  Given your liver history you should have very limited Tylenol use and try to avoid it as well    Instead try using the lidocaine patches at nighttime and removed during the day.  This is not getting better then please call the orthopedic number to get a follow-up ointment so they discuss further workup of your shoulder pain  Degenerative spurring at the acromioclavicular joint that is mild.

## 2023-08-31 NOTE — ED Notes (Signed)
See triage note  Presents with right shoulder pain  States he developed pain 2 weeks ago Denies any new injury  States he developed pain while helping someone move  No  deformity noted

## 2023-10-24 ENCOUNTER — Emergency Department: Payer: Managed Care, Other (non HMO)

## 2023-10-24 ENCOUNTER — Other Ambulatory Visit: Payer: Self-pay

## 2023-10-24 ENCOUNTER — Encounter: Payer: Self-pay | Admitting: Emergency Medicine

## 2023-10-24 ENCOUNTER — Inpatient Hospital Stay
Admission: EM | Admit: 2023-10-24 | Discharge: 2023-10-30 | DRG: 441 | Disposition: A | Discharge status: Home / Self Care | Payer: Managed Care, Other (non HMO) | Attending: Internal Medicine | Admitting: Internal Medicine

## 2023-10-24 DIAGNOSIS — R042 Hemoptysis: Secondary | ICD-10-CM | POA: Diagnosis present

## 2023-10-24 DIAGNOSIS — Z86711 Personal history of pulmonary embolism: Secondary | ICD-10-CM

## 2023-10-24 DIAGNOSIS — E785 Hyperlipidemia, unspecified: Secondary | ICD-10-CM | POA: Diagnosis present

## 2023-10-24 DIAGNOSIS — N179 Acute kidney failure, unspecified: Secondary | ICD-10-CM | POA: Diagnosis present

## 2023-10-24 DIAGNOSIS — Z79899 Other long term (current) drug therapy: Secondary | ICD-10-CM

## 2023-10-24 DIAGNOSIS — I8500 Esophageal varices without bleeding: Secondary | ICD-10-CM

## 2023-10-24 DIAGNOSIS — D869 Sarcoidosis, unspecified: Secondary | ICD-10-CM

## 2023-10-24 DIAGNOSIS — K922 Gastrointestinal hemorrhage, unspecified: Secondary | ICD-10-CM | POA: Diagnosis not present

## 2023-10-24 DIAGNOSIS — I8501 Esophageal varices with bleeding: Secondary | ICD-10-CM | POA: Diagnosis present

## 2023-10-24 DIAGNOSIS — N182 Chronic kidney disease, stage 2 (mild): Secondary | ICD-10-CM | POA: Diagnosis present

## 2023-10-24 DIAGNOSIS — D509 Iron deficiency anemia, unspecified: Secondary | ICD-10-CM | POA: Diagnosis present

## 2023-10-24 DIAGNOSIS — D62 Acute posthemorrhagic anemia: Secondary | ICD-10-CM | POA: Diagnosis not present

## 2023-10-24 DIAGNOSIS — Z86718 Personal history of other venous thrombosis and embolism: Secondary | ICD-10-CM | POA: Diagnosis not present

## 2023-10-24 DIAGNOSIS — Z7901 Long term (current) use of anticoagulants: Secondary | ICD-10-CM | POA: Diagnosis not present

## 2023-10-24 DIAGNOSIS — I864 Gastric varices: Secondary | ICD-10-CM | POA: Diagnosis present

## 2023-10-24 DIAGNOSIS — K920 Hematemesis: Secondary | ICD-10-CM | POA: Diagnosis present

## 2023-10-24 DIAGNOSIS — D6832 Hemorrhagic disorder due to extrinsic circulating anticoagulants: Secondary | ICD-10-CM | POA: Diagnosis present

## 2023-10-24 DIAGNOSIS — Z95828 Presence of other vascular implants and grafts: Secondary | ICD-10-CM

## 2023-10-24 DIAGNOSIS — K921 Melena: Secondary | ICD-10-CM | POA: Diagnosis present

## 2023-10-24 DIAGNOSIS — R188 Other ascites: Secondary | ICD-10-CM | POA: Diagnosis present

## 2023-10-24 DIAGNOSIS — D6851 Activated protein C resistance: Secondary | ICD-10-CM | POA: Insufficient documentation

## 2023-10-24 DIAGNOSIS — D6959 Other secondary thrombocytopenia: Secondary | ICD-10-CM | POA: Diagnosis not present

## 2023-10-24 DIAGNOSIS — D500 Iron deficiency anemia secondary to blood loss (chronic): Secondary | ICD-10-CM | POA: Diagnosis not present

## 2023-10-24 DIAGNOSIS — D8689 Sarcoidosis of other sites: Secondary | ICD-10-CM | POA: Diagnosis present

## 2023-10-24 DIAGNOSIS — R109 Unspecified abdominal pain: Secondary | ICD-10-CM | POA: Diagnosis present

## 2023-10-24 DIAGNOSIS — I820 Budd-Chiari syndrome: Principal | ICD-10-CM | POA: Diagnosis present

## 2023-10-24 DIAGNOSIS — K4090 Unilateral inguinal hernia, without obstruction or gangrene, not specified as recurrent: Secondary | ICD-10-CM | POA: Diagnosis present

## 2023-10-24 DIAGNOSIS — E872 Acidosis, unspecified: Secondary | ICD-10-CM | POA: Diagnosis not present

## 2023-10-24 DIAGNOSIS — K7460 Unspecified cirrhosis of liver: Secondary | ICD-10-CM | POA: Diagnosis present

## 2023-10-24 DIAGNOSIS — I8511 Secondary esophageal varices with bleeding: Secondary | ICD-10-CM | POA: Diagnosis not present

## 2023-10-24 DIAGNOSIS — I5032 Chronic diastolic (congestive) heart failure: Secondary | ICD-10-CM | POA: Diagnosis present

## 2023-10-24 DIAGNOSIS — I130 Hypertensive heart and chronic kidney disease with heart failure and stage 1 through stage 4 chronic kidney disease, or unspecified chronic kidney disease: Secondary | ICD-10-CM | POA: Diagnosis present

## 2023-10-24 DIAGNOSIS — R040 Epistaxis: Secondary | ICD-10-CM | POA: Diagnosis present

## 2023-10-24 DIAGNOSIS — I10 Essential (primary) hypertension: Secondary | ICD-10-CM | POA: Diagnosis present

## 2023-10-24 LAB — HIV ANTIBODY (ROUTINE TESTING W REFLEX): HIV Screen 4th Generation wRfx: NONREACTIVE

## 2023-10-24 LAB — TYPE AND SCREEN
ABO/RH(D): A POS
Antibody Screen: NEGATIVE

## 2023-10-24 LAB — COMPREHENSIVE METABOLIC PANEL
ALT: 23 U/L (ref 0–44)
AST: 22 U/L (ref 15–41)
Albumin: 3.3 g/dL — ABNORMAL LOW (ref 3.5–5.0)
Alkaline Phosphatase: 92 U/L (ref 38–126)
Anion gap: 11 (ref 5–15)
BUN: 69 mg/dL — ABNORMAL HIGH (ref 6–20)
CO2: 22 mmol/L (ref 22–32)
Calcium: 9.2 mg/dL (ref 8.9–10.3)
Chloride: 110 mmol/L (ref 98–111)
Creatinine, Ser: 1.66 mg/dL — ABNORMAL HIGH (ref 0.61–1.24)
GFR, Estimated: 47 mL/min — ABNORMAL LOW (ref >60)
Glucose, Bld: 158 mg/dL — ABNORMAL HIGH (ref 70–99)
Potassium: 4.2 mmol/L (ref 3.5–5.1)
Sodium: 143 mmol/L (ref 135–145)
Total Bilirubin: 1.1 mg/dL (ref 0.0–1.2)
Total Protein: 6.3 g/dL — ABNORMAL LOW (ref 6.5–8.1)

## 2023-10-24 LAB — CBC
HCT: 28.2 % — ABNORMAL LOW (ref 39.0–52.0)
Hemoglobin: 9.1 g/dL — ABNORMAL LOW (ref 13.0–17.0)
MCH: 30.2 pg (ref 26.0–34.0)
MCHC: 32.3 g/dL (ref 30.0–36.0)
MCV: 93.7 fL (ref 80.0–100.0)
Platelets: 167 10*3/uL (ref 150–400)
RBC: 3.01 MIL/uL — ABNORMAL LOW (ref 4.22–5.81)
RDW: 15.2 % (ref 11.5–15.5)
WBC: 16.6 10*3/uL — ABNORMAL HIGH (ref 4.0–10.5)
nRBC: 0 % (ref 0.0–0.2)

## 2023-10-24 LAB — APTT: aPTT: 27 s (ref 24–36)

## 2023-10-24 LAB — URINALYSIS, ROUTINE W REFLEX MICROSCOPIC
Bacteria, UA: NONE SEEN
Bilirubin Urine: NEGATIVE
Glucose, UA: NEGATIVE mg/dL
Ketones, ur: NEGATIVE mg/dL
Leukocytes,Ua: NEGATIVE
Nitrite: NEGATIVE
Protein, ur: NEGATIVE mg/dL
Specific Gravity, Urine: 1.035 — ABNORMAL HIGH (ref 1.005–1.030)
Squamous Epithelial / HPF: 0 /[HPF] (ref 0–5)
pH: 5 (ref 5.0–8.0)

## 2023-10-24 LAB — HEMOGLOBIN AND HEMATOCRIT, BLOOD
HCT: 26.1 % — ABNORMAL LOW (ref 39.0–52.0)
HCT: 23.5 % — ABNORMAL LOW (ref 39.0–52.0)
Hemoglobin: 8.5 g/dL — ABNORMAL LOW (ref 13.0–17.0)
Hemoglobin: 7.8 g/dL — ABNORMAL LOW (ref 13.0–17.0)

## 2023-10-24 LAB — PROTIME-INR
INR: 1.4 — ABNORMAL HIGH (ref 0.8–1.2)
Prothrombin Time: 17.3 s — ABNORMAL HIGH (ref 11.4–15.2)

## 2023-10-24 LAB — LIPASE, BLOOD: Lipase: 26 U/L (ref 11–51)

## 2023-10-24 MED ORDER — SODIUM CHLORIDE 0.9 % IV SOLN
1.0000 g | Freq: Once | INTRAVENOUS | Status: AC
  Administered 2023-10-24: 1 g via INTRAVENOUS
  Filled 2023-10-24: qty 10

## 2023-10-24 MED ORDER — ONDANSETRON HCL 4 MG/2ML IJ SOLN
4.0000 mg | Freq: Once | INTRAMUSCULAR | Status: AC
  Administered 2023-10-24: 4 mg via INTRAVENOUS
  Filled 2023-10-24: qty 2

## 2023-10-24 MED ORDER — ONDANSETRON HCL 4 MG/2ML IJ SOLN: 4.0000 mg | Freq: Four times a day (QID) | INTRAMUSCULAR | Status: DC | PRN

## 2023-10-24 MED ORDER — PANTOPRAZOLE SODIUM 40 MG IV SOLR
40.0000 mg | Freq: Two times a day (BID) | INTRAVENOUS | Status: DC
  Administered 2023-10-24 – 2023-10-27 (×7): 40 mg via INTRAVENOUS
  Filled 2023-10-24 (×7): qty 10

## 2023-10-24 MED ORDER — PANTOPRAZOLE SODIUM 40 MG IV SOLR
80.0000 mg | Freq: Once | INTRAVENOUS | Status: AC
  Administered 2023-10-24: 80 mg via INTRAVENOUS
  Filled 2023-10-24: qty 20

## 2023-10-24 MED ORDER — IOHEXOL 350 MG/ML SOLN
100.0000 mL | Freq: Once | INTRAVENOUS | Status: AC | PRN
  Administered 2023-10-24: 100 mL via INTRAVENOUS

## 2023-10-24 MED ORDER — SODIUM CHLORIDE 0.9 % IV SOLN
2.0000 g | INTRAVENOUS | Status: AC
  Administered 2023-10-25 – 2023-10-28 (×4): 2 g via INTRAVENOUS
  Filled 2023-10-24 (×4): qty 20

## 2023-10-24 MED ORDER — OCTREOTIDE LOAD VIA INFUSION
50.0000 ug | Freq: Once | INTRAVENOUS | Status: AC
  Administered 2023-10-24: 50 ug via INTRAVENOUS
  Filled 2023-10-24: qty 25

## 2023-10-24 MED ORDER — SODIUM CHLORIDE 0.9 % IV SOLN
50.0000 ug/h | INTRAVENOUS | Status: DC
  Administered 2023-10-24 – 2023-10-28 (×10): 50 ug/h via INTRAVENOUS
  Filled 2023-10-24 (×13): qty 1

## 2023-10-24 MED ORDER — ONDANSETRON HCL 4 MG PO TABS: 4.0000 mg | ORAL_TABLET | Freq: Four times a day (QID) | ORAL | Status: DC | PRN

## 2023-10-24 MED ORDER — DEXTROSE-SODIUM CHLORIDE 5-0.45 % IV SOLN: INTRAVENOUS | Status: AC

## 2023-10-24 NOTE — ED Provider Notes (Signed)
 [facility] Provider Note    Event Date/Time   First MD Initiated Contact with Patient 10/24/23 [phone]     (approximate)   History   Abdominal Pain and Hematemesis   HPI  Christian Fuentes is a 58 y.o. 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  Patient states that he started having weakness yesterday.  Noted some black stool.  Started having episodes of vomiting last night and this morning continued to have vomiting with worsening weakness.  Vomiting what looks to be blood.  Does take Eliquis  for history of multiple clots in the past and states he took his last dose yesterday morning.  Does endorse a history of cirrhosis.  Denies any alcohol use.  States that he had significant bleeding in 2019 that required intervention.  Denies any Goody powder or BC powder.     Physical Exam   Triage Vital Signs: ED Triage Vitals  Encounter Vitals Group     BP 10/24/23 0836 (!) 185/137     Systolic BP Percentile --      Diastolic BP Percentile --      Pulse Rate 10/24/23 0836 92     Resp 10/24/23 0836 20     Temp --      Temp src --      SpO2 10/24/23 0836 100 %     Weight 10/24/23 0842 161 lb (73 kg)     Height 10/24/23 0842 5' 9 (1.753 m)     Head Circumference --      Peak Flow --      Pain Score 10/24/23 0836 10     Pain Loc --      Pain Education --      Exclude from Growth Chart --     Most recent vital signs: Vitals:   10/24/23 0847 10/24/23 0930  BP: 113/67 103/67  Pulse:  (!) 47  Resp:  (!) 21  SpO2:  98%    Physical Exam Constitutional:      Appearance: He is well-developed.     Comments: Emesis bag of dark red  hematemesis  HENT:     Head: Atraumatic.  Eyes:     Conjunctiva/sclera: Conjunctivae normal.  Cardiovascular:     Rate and Rhythm: Regular rhythm.  Pulmonary:     Effort: No respiratory distress.  Abdominal:     Tenderness: There is no abdominal tenderness.  Musculoskeletal:     Cervical back: Normal range of motion.  Skin:    General: Skin is warm.     Capillary Refill: Capillary refill takes 2 to 3 seconds.  Neurological:     General: No focal deficit present.     Mental Status: He is alert. Mental status is at baseline.     IMPRESSION / MDM / ASSESSMENT AND PLAN / ED COURSE  I reviewed the triage vital signs and the nursing notes.  On chart review patient had embolization at [facility] in 2019.  History of gastric varices.  Patient has a history of Budd-Chiari's, factor V Leiden and multiple blood clots, currently on Eliquis   On arrival initially hypertensive however repeat blood pressure 100/67.  No tachycardia.  Patient was typed and screened.    RADIOLOGY  CTA abdomen and pelvis obtained and currently pending  LABS (all labs ordered are listed, but only abnormal results are displayed) Labs interpreted as -    Labs Reviewed  COMPREHENSIVE METABOLIC PANEL - Abnormal; Notable for the following components:      Result Value   Glucose, Bld 158 (*)    BUN 69 (*)    Creatinine, Ser 1.66 (*)    Total Protein 6.3 (*)    Albumin 3.3 (*)    GFR, Estimated 47 (*)    All other components within normal limits  CBC - Abnormal; Notable for the following components:   WBC 16.6 (*)    RBC 3.01 (*)    Hemoglobin 9.1 (*)    HCT 28.2 (*)    All other components within normal limits  PROTIME-INR - Abnormal; Notable for the following components:   Prothrombin  Time 17.3 (*)    INR 1.4 (*)    All other components within normal limits  LIPASE, BLOOD  APTT  URINALYSIS, ROUTINE W REFLEX MICROSCOPIC  TYPE AND SCREEN     MDM  Patient presents to the emergency department with  concerns for upper GI bleed.  Patient does have a history of cirrhosis and varices.  No Goody's powder or BC powder.  Patient is on anticoagulation.  Soft blood pressure in the emergency department but hemoglobin is stable but significantly lower than his baseline.  Hemoglobin is 9.1.  Baseline of 12.5.  Currently we will hold off on reversing his Eliquis  given multiple blood clots.  Patient was started on IV Protonix , Rocephin  to cover for SBP and IV octreotide .  Consulted and discussed the patient's case with gastroenterology Dr. [name] who will evaluate the patient in the emergency department.  Consulted hospitalist for admission for concern for upper GI bleed.     PROCEDURES:  Critical Care performed: yes  .Critical Care  Performed by: Suzanne Kirsch, MD Authorized by: Suzanne Kirsch, MD   Critical care provider statement:    Critical care time (minutes):  30   Critical care time was exclusive of:  Separately billable procedures and treating other patients   Critical care was necessary to treat or prevent imminent or life-threatening deterioration of the following conditions:  Hepatic failure and circulatory failure   Critical care was time spent personally by me on the following activities:  Development of treatment plan with patient or surrogate, discussions with consultants, evaluation of patient's response to treatment, examination of patient, ordering and review of laboratory studies, ordering and review of radiographic studies, ordering and performing treatments and interventions, pulse oximetry, re-evaluation of patient's condition and review of old charts   Care discussed with: admitting provider     Patient's presentation is most consistent with acute presentation with potential threat to life or bodily function.   MEDICATIONS ORDERED IN ED: Medications  octreotide  (SANDOSTATIN ) 2 mcg/mL load via infusion 50 mcg (50 mcg Intravenous Bolus from Bag 10/24/23 0943)    And   octreotide  (SANDOSTATIN ) 500 mcg in sodium chloride  0.9 % 250 mL (2 mcg/mL) infusion (50 mcg/hr Intravenous New Bag/Given 10/24/23 0939)  ondansetron  (ZOFRAN ) injection 4 mg (4 mg Intravenous Given 10/24/23 0859)  pantoprazole  (PROTONIX ) injection 80 mg (80 mg Intravenous Given 10/24/23 0858)  cefTRIAXone  (ROCEPHIN ) 1 g in sodium chloride  0.9 % 100 mL IVPB (0 g Intravenous Stopped 10/24/23 0955)  iohexol  (OMNIPAQUE ) 350 MG/ML injection 100 mL (100 mLs Intravenous Contrast Given 10/24/23 1005)    FINAL CLINICAL IMPRESSION(S) / ED DIAGNOSES  Final diagnoses:  Upper GI bleed     Rx / DC Orders   ED Discharge Orders     None        Note:  This document was prepared using Dragon voice recognition software and may include unintentional dictation errors.   Suzanne Kirsch, MD 10/24/23 1020

## 2023-10-24 NOTE — Assessment & Plan Note (Signed)
 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

## 2023-10-24 NOTE — ED Notes (Signed)
 This nurse gave pt urinal per request

## 2023-10-24 NOTE — ED Notes (Addendum)
 Pt had one small episode of coffee ground vomit.  New warm blankets given. NAD. Call light within reach.

## 2023-10-24 NOTE — ED Notes (Signed)
 Pt taken to room 11 pt had near syncopal episode in triage where he was staring while sitting in triage, pt vomiting dark red blood once in room. Appears more responsive at this time.

## 2023-10-24 NOTE — Assessment & Plan Note (Addendum)
 Noted Budd Chiari disease w/ cirrhosis, +ascites, non-bleeding esophageal varices, +gastric varices Noted active UGIB  Looks to not be taking nadolol On octreotide and IV PPI in ER Follow up GI recommendations

## 2023-10-24 NOTE — H&P (Addendum)
 History and Physical    Patient: Christian Fuentes FMW:[id number] DOB: [birth year] DOA: 10/24/2023 DOS: the patient was seen and examined on 10/24/2023 PCP: [name], MD  Patient coming from: Home  Chief Complaint:  Chief Complaint  Patient presents with   Abdominal Pain   Hematemesis   HPI: Christian Fuentes is a 58 y.o. 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  Patient reports episode of bloody emesis from last night.  Noted prior history of upper GI bleeding from July 2019.  Has been compliant with daily Eliquis .  Denies any alcohol or NSAID use.  Minimal to mild abdominal pain.  No diarrhea.  Positive black stools overnight.  No reported significant episodes recently apart from bloody emesis yesterday.  Positive mild weakness.  Patient taking only Eliquis  daily.  Other medications including the nadolol  were not reported to have been taken. Presented to the ER afebrile, hemodynamically stable.  Satting well on room air.  White count 16.6, hemoglobin 9.1, platelets 167, creatinine 1.66, glucose 158, type and screen in the ER.  CT angio GI bleed pending. Review of Systems: As mentioned in the history of present illness. All other systems reviewed and are negative. Past Medical History:  Diagnosis Date   Cirrhosis (HCC)    Factor 5 Leiden mutation, heterozygous (HCC)    Pulmonary embolism (HCC)    Sarcoidosis    Past Surgical History:  Procedure Laterality Date   CAROTID STENT     Social History:  reports that he has never smoked. He has never used smokeless tobacco. He reports that he does not drink alcohol and does not use drugs.  Allergies  Allergen Reactions    Diphenhydramine Hcl Nausea And Vomiting    History reviewed. No pertinent family history.  Prior to Admission medications   Medication Sig Start Date End Date Taking? Authorizing Provider  amLODipine  (NORVASC ) 5 MG tablet Take 1 tablet (5 mg total) by mouth daily. Patient not taking: Reported on 01/31/2018 01/10/18   Yisroel Sleight, MD  apixaban  (ELIQUIS ) 5 MG TABS tablet Take 5 mg by mouth 2 (two) times daily. 03/21/16   [provider]  furosemide  (LASIX ) 40 MG tablet Take 1 tablet (40 mg total) by mouth daily. 02/07/18   [name] Carmine, MD  nadolol  (CORGARD ) 40 MG tablet Take 1 tablet (40 mg total) by mouth daily. 01/31/18   [name] Carmine, MD  pantoprazole  (PROTONIX ) 40 MG tablet Take 1 tablet by mouth daily. 05/15/18   [provider]  potassium chloride  20 MEQ TBCR Take 20 mEq by mouth daily. Patient taking differently: Take 20 mEq by mouth 2 (two) times daily.  01/10/18   Yisroel Sleight, MD  predniSONE  (DELTASONE ) 10 MG tablet 4 tablets once a day for 2 days, 3 tabs for 2 days, 2 tabs for 2 days and 1 tab for 2 days 01/02/23   Saunders Shona CROME, PA-C  salicylic acid  17 % gel Apply topically daily. 12/27/19   Claudene Tanda POUR, PA-C  spironolactone  (ALDACTONE ) 100 MG tablet Take 1 tablet (100 mg total) by mouth daily. 02/07/18   [name] Carmine, MD  traMADol  (ULTRAM ) 50 MG tablet Take 1 tablet (50 mg total) by mouth every 6 (six) hours as needed. 05/05/23   Triplett,  Kirk NOVAK, FNP    Physical Exam: Vitals:   10/24/23 0836 10/24/23 0842 10/24/23 0847 10/24/23 0930  BP: (!) 185/137  113/67 103/67  Pulse: 92   (!) 47  Resp: 20   (!) 21  SpO2: 100%   98%  Weight:  73 kg    Height:  5' 9 (1.753 m)     Physical Exam Constitutional:      Appearance: He is normal weight.  HENT:     Head: Normocephalic and atraumatic.     Nose: Nose normal.     Mouth/Throat:     Mouth: Mucous membranes are moist.  Eyes:     Pupils: Pupils are equal, round, and reactive to light.  Cardiovascular:      Rate and Rhythm: Normal rate and regular rhythm.  Pulmonary:     Effort: Pulmonary effort is normal.  Abdominal:     General: Abdomen is flat.  Genitourinary:    Penis: Normal.   Skin:    General: Skin is warm.  Neurological:     General: No focal deficit present.  Psychiatric:        Mood and Affect: Mood normal.     Data Reviewed:  There are no new results to review at this time.  CT ANGIO GI BLEED CLINICAL DATA:  58 year old male with cirrhosis, hematemesis, melena. Abdominal pain, weakness, legs feel hot.  EXAM: CTA ABDOMEN AND PELVIS WITHOUT AND WITH CONTRAST  TECHNIQUE: Multidetector CT imaging of the abdomen and pelvis was performed using the standard protocol during bolus administration of intravenous contrast. Multiplanar reconstructed images and MIPs were obtained and reviewed to evaluate the vascular anatomy.  RADIATION DOSE REDUCTION: This exam was performed according to the departmental dose-optimization program which includes automated exposure control, adjustment of the mA and/or kV according to patient size and/or use of iterative reconstruction technique.  CONTRAST:  OMNIPAQUE  IOHEXOL  350 MG/ML SOLN  COMPARISON:  CT Abdomen and Pelvis 01/09/2018.  FINDINGS: VASCULAR  Chronic severe and enhancing esophageal and gastric varices. Superimposed gastric patent ligament varices. No contrast extravasation into the lumen of the upper gastrointestinal tract is identified, but sensitivity is decreased due to the bulky and enhanced [place]on varices.  No contrast extravasation identified into the lumen of the small bowel or the colon.  Normal caliber abdominal aorta with major arterial structures patent and no significant atherosclerosis in the abdomen or pelvis.  Chronically stented hepatic IVC and inferior cavoatrial junction. The stent is enhancing on the delayed phase and appears patent. However, the IVC just caudal to the stent appears  atretic and largely nonenhancing on series 17, image 69, was more normal in 2019. The renal level IVC appears to remain patent. Portal venous system is patent.  Review of the MIP images confirms the above findings.  NON-VASCULAR  Lower chest: Heart size remains normal. No pericardial effusion. Chronic inferior cavoatrial junction venous vascular stent which was present in 2019. Bulky and enhancing esophageal varices also similar to the 2019 CT (series 7, image 8). No pleural effusion. Lung bases are clear.  Hepatobiliary: Nodular and heterogeneous cirrhotic liver. Multiple liver masses cannot be excluded by CT. Gallbladder appears negative. Embolic material in the right hepatic lobe on series 17, image 62. Collapsed appearing vascular stent along the posterior liver might have been within hepatic vein on series 17, image 42. Chronic vascular stent within the hepatic IVC as above.  Pancreas: Negative.  Spleen: Splenomegaly has not significantly changed since 2019 and is mild-to-moderate. No discrete  splenic lesion.  Adrenals/Urinary Tract: Normal adrenal glands. Stable kidneys since 2019 with combined chronic partial renal atrophy and a degree of bilateral congenital renal malrotation. Kidneys appear nonobstructed. Nondilated ureters. Decompressed urinary bladder. Pelvic phleboliths.  Stomach/Bowel: Retained stool in the rectum but otherwise decompressed large bowel. Herniated cecum, terminal ileum into the right inguinal canal, further detailed below. Small volume of simple density free fluid there also but no evidence of associated bowel obstruction or definite bowel inflammation. No dilated small bowel.  Chronic severe gastric (series 17, image 46) and distal esophageal (series 17, image 23) enhancing varices. Stomach and duodenum are decompressed. No free air or free fluid.  Lymphatic: No lymphadenopathy identified in the abdomen or pelvis.  Reproductive: Chronic large  right inguinal hernia, previously contained ascites and now contains bowel plus a small volume of simple appearing free fluid. This appears to be herniation of the cecum and the terminal ileum into the scrotum. And evidence that normal appendix is also herniated on series 20, image 25.  The hernia now is at least 13 cm in length.  Other: No pelvis free fluid.  Musculoskeletal: No acute or suspicious osseous lesion identified.  IMPRESSION: 1. Very severe Cirrhosis (see #3), distal esophageal and gastric Varices. No active gastrointestinal bleeding by CTA, but sensitivity decreased due to the presence of bulky and enhancing mucosal varices.  2. Chronically stented hepatic IVC is patent, but the IVC just caudal to the liver appears atretic and segmentally occluded. The IVC is patent again at the level of the renal veins.  3. Progressively abnormal CT appearance of the liver since 2019. Underlying Liver mass(es) cannot be excluded.  4. Chronic large right inguinal hernia, now containing nonobstructed terminal ileum, cecum, and appendix. Small volume of ascites also within the hernia sac. But no strong evidence of hernia incarceration.  5. No other acute or inflammatory process identified. No atherosclerosis in the abdomen or pelvis.  Electronically Signed   By: VEAR Hurst M.D.   On: 10/24/2023 10:51  Lab Results  Component Value Date   WBC 16.6 (H) 10/24/2023   HGB 9.1 (L) 10/24/2023   HCT 28.2 (L) 10/24/2023   MCV 93.7 10/24/2023   PLT 167 10/24/2023   Last metabolic panel Lab Results  Component Value Date   GLUCOSE 158 (H) 10/24/2023   NA 143 10/24/2023   K 4.2 10/24/2023   CL 110 10/24/2023   CO2 22 10/24/2023   BUN 69 (H) 10/24/2023   CREATININE 1.66 (H) 10/24/2023   GFRNONAA 47 (L) 10/24/2023   CALCIUM  9.2 10/24/2023   PROT 6.3 (L) 10/24/2023   ALBUMIN 3.3 (L) 10/24/2023   BILITOT 1.1 10/24/2023   ALKPHOS 92 10/24/2023   AST 22 10/24/2023   ALT 23  10/24/2023   ANIONGAP 11 10/24/2023    Assessment and Plan: * GI bleed Melanotic stools in setting of history of esophageal varices, ascites as well as factor V Leyden deficiency with 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 [name] consulted for further evaluation Systolic blood pressure in the 100s Trend hemoglobin Transfuse for hemoglobin less than 7 or symptomatic Noted to have had similar presentation July 2019 transfer to [facility] system for emergent VIR segment 5 hepatic artery pseudoaneurysm embolization  Judicious monitoring Follow-up gastroenterology recommendations  Factor V Leiden (HCC) Baseline factor V Leyden deficiency with noted history of recurrent  VTE on Eliquis  IVC filter in place Hold Eliquis  in setting of upper GI bleeding   Hypertension, benign Lower limit of normal blood pressure on presentation Monitor with active GI bleed closely Follow  CKD (chronic kidney disease), stage II Creatinine 1.66 today with GFR in the 40s Within baseline Monitor  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 Inpatient status is judged to be reasonable and necessary in order to provide the required intensity of service to ensure the patient's safety. The patient's presenting symptoms, physical exam findings, and initial radiographic and  laboratory data in the context of their chronic comorbidities is felt to place them at high risk for further clinical deterioration. Furthermore, it is not anticipated that the patient will be medically stable for discharge from the hospital within 2 midnights of admission.   * I certify that at the point of admission it is my clinical judgment that the patient will require inpatient hospital care spanning beyond 2 midnights from the point of admission due to high intensity of service, high risk for further deterioration and high frequency of surveillance required.*  Author: Elspeth [name] Masters, MD 10/24/2023 11:02 AM  For on call review [email or web address].

## 2023-10-24 NOTE — Assessment & Plan Note (Signed)
 Lower limit of normal blood pressure on presentation Monitor with active GI bleed closely Follow

## 2023-10-24 NOTE — Consult Note (Signed)
 [name], MD [facility]  [address] [place], [place] [address] Phone: [phone] Fax : [phone]  Consultation  Referring Provider:     Dr. Suzanne Primary Care Physician:  [name], MD Primary Gastroenterologist: [facility] hepatology         Reason for Consultation:     GI bleed  Date of Admission:  10/24/2023 Date of Consultation:  10/24/2023         HPI:   Christian Fuentes is a 58 y.o. male who is followed at [facility] liver transplant center and last seen in 2023 there.  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  The patient has been on Eliquis  with recurrent PEs. The patient was deemed compensated but previously had bleeding of gastric varices requiring BATO in 2019.  The patient also had a history of ascites but it was reported that he had not needed a paracentesis for several years. Patient's MELD score back in 2023 was 17 and his MELD score now is 15. The patient had come to the emergency room because he states he started feeling weakness yesterday and noted some black stools.  Had episodes of vomiting last night and this morning continued to vomit with continued weakness.  He reported that the vomit looked like blood.  His last dose of Eliquis  was yesterday morning.  The patient had a CT angiography that showed:  IMPRESSION: 1. Very severe Cirrhosis (see #3), distal esophageal and gastric Varices. No active gastrointestinal bleeding by CTA, but sensitivity decreased due to the presence of bulky and enhancing mucosal varices.   2. Chronically stented hepatic IVC is patent, but the IVC just caudal to the liver appears atretic and segmentally occluded. The IVC is patent again at the level of the renal veins.   3. Progressively abnormal CT appearance of the liver since 2019. Underlying Liver mass(es) cannot be excluded.   4.  Chronic large right inguinal hernia, now containing nonobstructed terminal ileum, cecum, and appendix. Small volume of ascites also within the hernia sac. But no strong evidence of hernia incarceration.   5. No other acute or inflammatory process identified. No atherosclerosis in the abdomen or pelvis.  The patient had blood work that showed:  Component     Latest Ref Rng 12/17/2022 10/24/2023  Hemoglobin     13.0 - 17.0 g/dL 87.4 (L)  9.1 (L)   HCT     39.0 - 52.0 % 38.1 (L)  28.2 (L)    When contacted about this patient by the emergency room doctor, the patient was started on octreotide  and his blood pressure was 100/60.  The patient reports that he stopped his Eliquis  yesterday morning and did not take anything after the morning dose yesterday.  He also did a history of hepatitis C or any alcohol abuse in the past.  Past Medical History:  Diagnosis Date  . Cirrhosis (HCC)   . Factor 5 Leiden mutation, heterozygous (HCC)   . Pulmonary embolism (HCC)   . Sarcoidosis     Past Surgical History:  Procedure Laterality Date  . CAROTID STENT      Prior to Admission medications   Medication Sig Start Date End Date Taking? Authorizing Provider  amLODipine  (NORVASC ) 5 MG tablet Take 1 tablet (5 mg total) by mouth daily. Patient not taking: Reported on 01/31/2018 01/10/18  Sudini, Srikar, MD  apixaban  (ELIQUIS ) 5 MG TABS tablet Take 5 mg by mouth 2 (two) times daily. 03/21/16   [provider]  furosemide  (LASIX ) 40 MG tablet Take 1 tablet (40 mg total) by mouth daily. 02/07/18   [name] Carmine, MD  nadolol  (CORGARD ) 40 MG tablet Take 1 tablet (40 mg total) by mouth daily. 01/31/18   [name] Carmine, MD  pantoprazole  (PROTONIX ) 40 MG tablet Take 1 tablet by mouth daily. 05/15/18   [provider]  potassium chloride  20 MEQ TBCR Take 20 mEq by mouth daily. Patient taking differently: Take 20 mEq by mouth 2 (two) times daily.  01/10/18   Yisroel Sleight, MD  predniSONE  (DELTASONE )  10 MG tablet 4 tablets once a day for 2 days, 3 tabs for 2 days, 2 tabs for 2 days and 1 tab for 2 days 01/02/23   Saunders Shona CROME, PA-C  salicylic acid  17 % gel Apply topically daily. 12/27/19   Claudene Tanda POUR, PA-C  spironolactone  (ALDACTONE ) 100 MG tablet Take 1 tablet (100 mg total) by mouth daily. 02/07/18   [name] Carmine, MD  traMADol  (ULTRAM ) 50 MG tablet Take 1 tablet (50 mg total) by mouth every 6 (six) hours as needed. 05/05/23   Herlinda Kirk NOVAK, FNP    History reviewed. No pertinent family history.   Social History   Tobacco Use  . Smoking status: Never  . Smokeless tobacco: Never  Vaping Use  . Vaping status: Never Used  Substance Use Topics  . Alcohol use: No  . Drug use: No    Allergies as of 10/24/2023 - Review Complete 10/24/2023  Allergen Reaction Noted  . Diphenhydramine hcl Nausea And Vomiting 12/26/2016    Review of Systems:    All systems reviewed and negative except where noted in HPI.   Physical Exam:  Vital signs in last 24 hours: Pulse Rate:  [47-92] 86 (01/08 1230) Resp:  [17-21] 17 (01/08 1230) BP: (103-185)/(61-137) 113/61 (01/08 1230) SpO2:  [98 %-100 %] 100 % (01/08 1230) Weight:  [73 kg] 73 kg (01/08 0842)   General:   Pleasant, cooperative in NAD Head:  Normocephalic and atraumatic. Eyes:   No icterus.   Conjunctiva pink. PERRLA. Ears:  Normal auditory acuity. Neck:  Supple; no masses or thyroidomegaly Lungs: Respirations even and unlabored. Lungs clear to auscultation bilaterally.   No wheezes, crackles, or rhonchi.  Heart:  Regular rate and rhythm;  Without murmur, clicks, rubs or gallops Abdomen:  Soft, nondistended, nontender. Normal bowel sounds. No appreciable masses or hepatomegaly.  No rebound or guarding.  Rectal:  Not performed. Msk:  Symmetrical without gross deformities.    Extremities:  Without edema, cyanosis or clubbing. Neurologic:  Alert and oriented x3;  grossly normal neurologically. Skin:  Intact without significant  lesions or rashes. Cervical Nodes:  No significant cervical adenopathy. Psych:  Alert and cooperative. Normal affect.  LAB RESULTS: Recent Labs    10/24/23 0853  WBC 16.6*  HGB 9.1*  HCT 28.2*  PLT 167   BMET Recent Labs    10/24/23 0853  NA 143  K 4.2  CL 110  CO2 22  GLUCOSE 158*  BUN 69*  CREATININE 1.66*  CALCIUM  9.2   LFT Recent Labs    10/24/23 0853  PROT 6.3*  ALBUMIN 3.3*  AST 22  ALT 23  ALKPHOS 92  BILITOT 1.1   PT/INR Recent Labs    10/24/23 0853  LABPROT 17.3*  INR 1.4*    STUDIES: CT  ANGIO GI BLEED Result Date: 10/24/2023 CLINICAL DATA:  58 year old male with cirrhosis, hematemesis, melena. Abdominal pain, weakness, legs feel hot. EXAM: CTA ABDOMEN AND PELVIS WITHOUT AND WITH CONTRAST TECHNIQUE: Multidetector CT imaging of the abdomen and pelvis was performed using the standard protocol during bolus administration of intravenous contrast. Multiplanar reconstructed images and MIPs were obtained and reviewed to evaluate the vascular anatomy. RADIATION DOSE REDUCTION: This exam was performed according to the departmental dose-optimization program which includes automated exposure control, adjustment of the mA and/or kV according to patient size and/or use of iterative reconstruction technique. CONTRAST:  OMNIPAQUE  IOHEXOL  350 MG/ML SOLN COMPARISON:  CT Abdomen and Pelvis 01/09/2018. FINDINGS: VASCULAR Chronic severe and enhancing esophageal and gastric varices. Superimposed gastric patent ligament varices. No contrast extravasation into the lumen of the upper gastrointestinal tract is identified, but sensitivity is decreased due to the bulky and enhanced Seton varices. No contrast extravasation identified into the lumen of the small bowel or the colon. Normal caliber abdominal aorta with major arterial structures patent and no significant atherosclerosis in the abdomen or pelvis. Chronically stented hepatic IVC and inferior cavoatrial junction. The  stent is enhancing on the delayed phase and appears patent. However, the IVC just caudal to the stent appears atretic and largely nonenhancing on series 17, image 69, was more normal in 2019. The renal level IVC appears to remain patent. Portal venous system is patent. Review of the MIP images confirms the above findings. NON-VASCULAR Lower chest: Heart size remains normal. No pericardial effusion. Chronic inferior cavoatrial junction venous vascular stent which was present in 2019. Bulky and enhancing esophageal varices also similar to the 2019 CT (series 7, image 8). No pleural effusion. Lung bases are clear. Hepatobiliary: Nodular and heterogeneous cirrhotic liver. Multiple liver masses cannot be excluded by CT. Gallbladder appears negative. Embolic material in the right hepatic lobe on series 17, image 62. Collapsed appearing vascular stent along the posterior liver might have been within hepatic vein on series 17, image 42. Chronic vascular stent within the hepatic IVC as above. Pancreas: Negative. Spleen: Splenomegaly has not significantly changed since 2019 and is mild-to-moderate. No discrete splenic lesion. Adrenals/Urinary Tract: Normal adrenal glands. Stable kidneys since 2019 with combined chronic partial renal atrophy and a degree of bilateral congenital renal malrotation. Kidneys appear nonobstructed. Nondilated ureters. Decompressed urinary bladder. Pelvic phleboliths. Stomach/Bowel: Retained stool in the rectum but otherwise decompressed large bowel. Herniated cecum, terminal ileum into the right inguinal canal, further detailed below. Small volume of simple density free fluid there also but no evidence of associated bowel obstruction or definite bowel inflammation. No dilated small bowel. Chronic severe gastric (series 17, image 46) and distal esophageal (series 17, image 23) enhancing varices. Stomach and duodenum are decompressed. No free air or free fluid. Lymphatic: No lymphadenopathy identified  in the abdomen or pelvis. Reproductive: Chronic large right inguinal hernia, previously contained ascites and now contains bowel plus a small volume of simple appearing free fluid. This appears to be herniation of the cecum and the terminal ileum into the scrotum. And evidence that normal appendix is also herniated on series 20, image 25. The hernia now is at least 13 cm in length. Other: No pelvis free fluid. Musculoskeletal: No acute or suspicious osseous lesion identified. IMPRESSION: 1. Very severe Cirrhosis (see #3), distal esophageal and gastric Varices. No active gastrointestinal bleeding by CTA, but sensitivity decreased due to the presence of bulky and enhancing mucosal varices. 2. Chronically stented hepatic IVC is patent, but the IVC  just caudal to the liver appears atretic and segmentally occluded. The IVC is patent again at the level of the renal veins. 3. Progressively abnormal CT appearance of the liver since 2019. Underlying Liver mass(es) cannot be excluded. 4. Chronic large right inguinal hernia, now containing nonobstructed terminal ileum, cecum, and appendix. Small volume of ascites also within the hernia sac. But no strong evidence of hernia incarceration. 5. No other acute or inflammatory process identified. No atherosclerosis in the abdomen or pelvis. Electronically Signed   By: VEAR Hurst M.D.   On: 10/24/2023 10:51      Impression / Plan:   Assessment: Principal Problem:   GI bleed Active Problems:   Budd-Chiari syndrome (HCC)   CKD (chronic kidney disease), stage II   Hypertension, benign   Factor V Leiden (HCC)   Christian Fuentes is a 58 y.o. y/o male with advanced cirrhosis and what appears to be an upper GI bleed.  The GI bleed is likely variceal bleeding.  The patient has been started on octreotide  and Protonix .  He also received 1 dose of ceftriaxone .  He has had no further sign of any active bleeding.  Plan:  Patient should continue receiving octreotide  and  Protonix .  The patient will also need to be kept on the ceftriaxone  1 g daily while he is here.  The patient will be set up for an EGD for tomorrow due to the need to hold his Eliquis .  Any intervention prior to the Eliquis  wearing off could worsen the patient's bleeding.  If the patient should start bleeding uncontrollably then the patient may need to be transferred for a TIPS procedure.  The patient has been explained the plan and agrees with it.  PPI IV twice daily  Continue serial CBCs and transfuse PRN Avoid NSAIDs Maintain 2 large-bore IV lines Please page GI with any acute hemodynamic changes, or signs of active GI bleeding   Thank you for involving me in the care of this patient.      LOS: 0 days   [name], MD, [facility] 10/24/2023, 12:58 PM,  Pager [phone] 7am-5pm  Check AMION for 5pm -7am coverage and on weekends   Note: This dictation was prepared with Dragon dictation along with smaller phrase technology. Any transcriptional errors that result from this process are unintentional.

## 2023-10-24 NOTE — ED Notes (Signed)
 Pt to CT

## 2023-10-24 NOTE — Assessment & Plan Note (Signed)
 Creatinine 1.66 today with GFR in the 40s Within baseline Monitor

## 2023-10-24 NOTE — ED Triage Notes (Addendum)
 Pt arrived via POV with daughter, reports abdominal pain, weakness, reports black bowel movement this morning. Pt also reporting his legs "feel hot" pt reports N/V. Pt reports lightheadedness with ambulation.

## 2023-10-24 NOTE — Assessment & Plan Note (Addendum)
 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 [name] consulted for further evaluation Systolic blood pressure in the 100s Trend hemoglobin Transfuse for hemoglobin less than 7 or symptomatic Noted to have had similar presentation July 2019 transfer to [facility] for emergent VIR segment 5 hepatic artery pseudoaneurysm embolization  Judicious monitoring Follow-up gastroenterology recommendations

## 2023-10-25 ENCOUNTER — Inpatient Hospital Stay: Payer: Managed Care, Other (non HMO) | Admitting: Anesthesiology

## 2023-10-25 ENCOUNTER — Encounter
Admission: EM | Disposition: A | Discharge status: Home / Self Care | Payer: Self-pay | Source: Home / Self Care | Attending: Family Medicine

## 2023-10-25 DIAGNOSIS — K922 Gastrointestinal hemorrhage, unspecified: Principal | ICD-10-CM

## 2023-10-25 DIAGNOSIS — I8500 Esophageal varices without bleeding: Secondary | ICD-10-CM | POA: Diagnosis not present

## 2023-10-25 DIAGNOSIS — I820 Budd-Chiari syndrome: Secondary | ICD-10-CM | POA: Diagnosis not present

## 2023-10-25 DIAGNOSIS — D62 Acute posthemorrhagic anemia: Secondary | ICD-10-CM | POA: Diagnosis not present

## 2023-10-25 DIAGNOSIS — K921 Melena: Secondary | ICD-10-CM

## 2023-10-25 DIAGNOSIS — I864 Gastric varices: Secondary | ICD-10-CM

## 2023-10-25 HISTORY — PX: ESOPHAGOGASTRODUODENOSCOPY (EGD) WITH PROPOFOL: SHX5813

## 2023-10-25 HISTORY — PX: ESOPHAGEAL BANDING: SHX5518

## 2023-10-25 LAB — CBC
HCT: 24.2 % — ABNORMAL LOW (ref 39.0–52.0)
Hemoglobin: 8 g/dL — ABNORMAL LOW (ref 13.0–17.0)
MCH: 31.4 pg (ref 26.0–34.0)
MCHC: 33.1 g/dL (ref 30.0–36.0)
MCV: 94.9 fL (ref 80.0–100.0)
Platelets: 100 10*3/uL — ABNORMAL LOW (ref 150–400)
RBC: 2.55 MIL/uL — ABNORMAL LOW (ref 4.22–5.81)
RDW: 15.5 % (ref 11.5–15.5)
WBC: 7.7 10*3/uL (ref 4.0–10.5)
nRBC: 0.3 % — ABNORMAL HIGH (ref 0.0–0.2)

## 2023-10-25 LAB — COMPREHENSIVE METABOLIC PANEL
ALT: 22 U/L (ref 0–44)
AST: 22 U/L (ref 15–41)
Albumin: 2.6 g/dL — ABNORMAL LOW (ref 3.5–5.0)
Alkaline Phosphatase: 68 U/L (ref 38–126)
Anion gap: 6 (ref 5–15)
BUN: 55 mg/dL — ABNORMAL HIGH (ref 6–20)
CO2: 21 mmol/L — ABNORMAL LOW (ref 22–32)
Calcium: 7.4 mg/dL — ABNORMAL LOW (ref 8.9–10.3)
Chloride: 115 mmol/L — ABNORMAL HIGH (ref 98–111)
Creatinine, Ser: 1.63 mg/dL — ABNORMAL HIGH (ref 0.61–1.24)
GFR, Estimated: 49 mL/min — ABNORMAL LOW (ref >60)
Glucose, Bld: 106 mg/dL — ABNORMAL HIGH (ref 70–99)
Potassium: 3.9 mmol/L (ref 3.5–5.1)
Sodium: 142 mmol/L (ref 135–145)
Total Bilirubin: 0.5 mg/dL (ref 0.0–1.2)
Total Protein: 5 g/dL — ABNORMAL LOW (ref 6.5–8.1)

## 2023-10-25 LAB — HEMOGLOBIN: Hemoglobin: 8.2 g/dL — ABNORMAL LOW (ref 13.0–17.0)

## 2023-10-25 SURGERY — ESOPHAGOGASTRODUODENOSCOPY (EGD) WITH PROPOFOL
Anesthesia: General

## 2023-10-25 MED ORDER — SUCRALFATE 1 GM/10ML PO SUSP
1.0000 g | Freq: Three times a day (TID) | ORAL | Status: DC
  Administered 2023-10-25 – 2023-10-30 (×15): 1 g via ORAL
  Filled 2023-10-25 (×16): qty 10

## 2023-10-25 MED ORDER — FENTANYL CITRATE (PF) 100 MCG/2ML IJ SOLN
INTRAMUSCULAR | Status: AC
  Filled 2023-10-25: qty 2

## 2023-10-25 MED ORDER — DEXMEDETOMIDINE HCL IN NACL 80 MCG/20ML IV SOLN
INTRAVENOUS | Status: DC | PRN
  Administered 2023-10-25: 4 ug via INTRAVENOUS

## 2023-10-25 MED ORDER — LIDOCAINE HCL (CARDIAC) PF 100 MG/5ML IV SOSY
PREFILLED_SYRINGE | INTRAVENOUS | Status: DC | PRN
  Administered 2023-10-25: 80 mg via INTRAVENOUS

## 2023-10-25 MED ORDER — FENTANYL CITRATE (PF) 100 MCG/2ML IJ SOLN
INTRAMUSCULAR | Status: DC | PRN
  Administered 2023-10-25: 50 ug via INTRAVENOUS

## 2023-10-25 MED ORDER — PROPOFOL 10 MG/ML IV BOLUS
INTRAVENOUS | Status: AC
  Filled 2023-10-25: qty 20

## 2023-10-25 MED ORDER — LIDOCAINE HCL (PF) 2 % IJ SOLN
INTRAMUSCULAR | Status: AC
  Filled 2023-10-25: qty 5

## 2023-10-25 MED ORDER — SODIUM CHLORIDE 0.9 % IV SOLN: INTRAVENOUS | Status: DC

## 2023-10-25 MED ORDER — PROPOFOL 500 MG/50ML IV EMUL
INTRAVENOUS | Status: DC | PRN
  Administered 2023-10-25: 140 ug/kg/min via INTRAVENOUS

## 2023-10-25 MED ORDER — EPHEDRINE SULFATE-NACL 50-0.9 MG/10ML-% IV SOSY
PREFILLED_SYRINGE | INTRAVENOUS | Status: DC | PRN
  Administered 2023-10-25: 10 mg via INTRAVENOUS

## 2023-10-25 NOTE — Progress Notes (Signed)
  Progress Note   Patient: Christian Fuentes FMW:[id number] DOB: 06-20-1966 DOA: 10/24/2023     1 DOS: the patient was seen and examined on 10/25/2023   Brief hospital course: EKAM BESSON is a 58 y.o. 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  Patient also had a black stool later in the evening. Patient is seen by GI, placed on IV Protonix , octreotide .  Consider EGD.   Principal Problem:   GI bleed Active Problems:   Budd-Chiari syndrome (HCC)   CKD (chronic kidney disease), stage II   Hypertension, benign   Factor V Leiden (HCC)   Upper GI bleed   Assessment and Plan: *Acute upper GI bleed. Acute blood loss anemia. Liver cirrhosis. Budd-Chiari syndrome. 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 Patient is seen by GI, planning for EGD.  Continue Protonix  and octreotide .  Continue Rocephin  for prophylaxis. Patient hemoglobin has dropped down to 8.0.  Continue to follow, transfuse as needed.  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 Baseline factor V Leyden deficiency with noted history of recurrent VTE on Eliquis  IVC filter in place Hold Eliquis  in setting of upper GI bleeding  Acute kidney injury. Mild metabolic acidosis. Patient baseline GFR more than 60, worsening renal function from GI bleed.  Patient currently receiving some fluids, continue to follow.   Hypertension, benign Lower limit of normal blood pressure on presentation Monitor with active GI bleed closely Follow        Subjective:  Patient last bowel movement was yesterday evening with black stool.  No longer has nausea vomiting today. No shortness of breath.  Physical  Exam: Vitals:   10/25/23 0001 10/25/23 0500 10/25/23 0608 10/25/23 0800  BP: 118/63 103/62  97/60  Pulse: 65 65  72  Resp: 16 18  18   Temp: 98.9 F (37.2 C)  98.4 F (36.9 C)   TempSrc: Oral  Oral   SpO2: 100% 100%  100%  Weight:      Height:       General exam: Appears calm and comfortable  Respiratory system: Clear to auscultation. Respiratory effort normal. Cardiovascular system: S1 & S2 heard, RRR. No JVD, murmurs, rubs, gallops or clicks. No pedal edema. Gastrointestinal system: Abdomen is nondistended, soft and nontender. No organomegaly or masses felt. Normal bowel sounds heard. Central nervous system: Alert and oriented. No focal neurological deficits. Extremities: Symmetric 5 x 5 power. Skin: No rashes, lesions or ulcers Psychiatry: Judgement and insight appear normal. Mood & affect appropriate.    Data Reviewed:  CT angiogram results reviewed.  Lab results reviewed.  Family Communication: None  Disposition: Status is: Inpatient Remains inpatient appropriate because: Severity of disease, IV treatment, inpatient procedure     Time spent: 35 minutes  Author: Murvin Mana, MD 10/25/2023 10:59 AM  For on call review [email or web address].

## 2023-10-25 NOTE — Transfer of Care (Signed)
 Immediate Anesthesia Transfer of Care Note  Patient: Christian Fuentes  Procedure(s) Performed: ESOPHAGOGASTRODUODENOSCOPY (EGD) WITH PROPOFOL  ESOPHAGEAL BANDING  Patient Location: PACU  Anesthesia Type:MAC  Level of Consciousness: awake and alert   Airway & Oxygen Therapy: Patient Spontanous Breathing  Post-op Assessment: Report given to RN and Post -op Vital signs reviewed and stable  Post vital signs: stable  Last Vitals:  Vitals Value Taken Time  BP 139/90 10/25/23 1305  Temp 36.1 C 10/25/23 1304  Pulse 83 10/25/23 1307  Resp 13 10/25/23 1307  SpO2 100 % 10/25/23 1307  Vitals shown include unfiled device data.  Last Pain:  Vitals:   10/25/23 1304  TempSrc: Temporal  PainSc: Asleep         Complications: No notable events documented.

## 2023-10-25 NOTE — Anesthesia Preprocedure Evaluation (Signed)
 Anesthesia Evaluation  Patient identified by MRN, date of birth, ID band Patient awake    Reviewed: Allergy & Precautions, NPO status , Patient's Chart, lab work & pertinent test results  History of Anesthesia Complications Negative for: history of anesthetic complications  Airway Mallampati: III  TM Distance: >3 FB Neck ROM: full    Dental  (+) Upper Dentures, Lower Dentures   Pulmonary neg pulmonary ROS, neg shortness of breath   Pulmonary exam normal        Cardiovascular Exercise Tolerance: Good hypertension, (-) angina Normal cardiovascular exam     Neuro/Psych  PSYCHIATRIC DISORDERS      negative neurological ROS     GI/Hepatic negative GI ROS, Neg liver ROS,,,  Endo/Other  negative endocrine ROS    Renal/GU Renal disease  negative genitourinary   Musculoskeletal   Abdominal   Peds  Hematology negative hematology ROS (+)   Anesthesia Other Findings Patient is NPO appropriate and reports no nausea or vomiting today.  Past Medical History: No date: Cirrhosis (HCC) No date: Factor 5 Leiden mutation, heterozygous (HCC) No date: Pulmonary embolism (HCC) No date: Sarcoidosis  Past Surgical History: No date: CAROTID STENT  BMI    Body Mass Index: 23.78 kg/m      Reproductive/Obstetrics negative OB ROS                             Anesthesia Physical Anesthesia Plan  ASA: 3  Anesthesia Plan: General   Post-op Pain Management:    Induction: Intravenous  PONV Risk Score and Plan: Propofol  infusion and TIVA  Airway Management Planned: Natural Airway and Nasal Cannula  Additional Equipment:   Intra-op Plan:   Post-operative Plan:   Informed Consent: I have reviewed the patients History and Physical, chart, labs and discussed the procedure including the risks, benefits and alternatives for the proposed anesthesia with the patient or authorized representative who has  indicated his/her understanding and acceptance.     Dental Advisory Given  Plan Discussed with: Anesthesiologist, CRNA and Surgeon  Anesthesia Plan Comments: (Patient consented for risks of anesthesia including but not limited to:  - adverse reactions to medications - risk of airway placement if required - damage to eyes, teeth, lips or other oral mucosa - nerve damage due to positioning  - sore throat or hoarseness - Damage to heart, brain, nerves, lungs, other parts of body or loss of life  Patient voiced understanding and assent.)       Anesthesia Quick Evaluation

## 2023-10-25 NOTE — Hospital Course (Addendum)
 Christian Fuentes is a 58 y.o. 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  Patient also had a black stool later in the evening. Patient is seen by GI, placed on IV Protonix , octreotide .   EGD was performed on 1/9, showed grade 3 esophageal varices, incompletely eradicated, banded.  Type II gastroesophageal varices, treated and without bleeding. Hemoglobin dropped down to 7.01/12, received 1 unit PRBC.  Anticoagulation still on hold. Patient was restarted on heparin  drip on 1/13 after stabilization of hemoglobin.  Hemoglobin still stable before discharge today, restarted Eliquis .

## 2023-10-25 NOTE — Op Note (Signed)
 [facility] Gastroenterology Patient Name: Christian Fuentes Procedure Date: 10/25/2023 12:24 PM MRN: [id number] Account #: [id number] Date of Birth: 1966-09-20 Admit Type: Inpatient Age: 58 Room: [facility] ENDO ROOM 4 Gender: Male Note Status: Finalized Instrument Name: Upper Endoscope [id number] Procedure:             Upper GI endoscopy Indications:           Melena Providers:             [name] MD, MD Referring MD:          No Local Md, MD (Referring MD) Medicines:             Propofol  per Anesthesia Complications:         No immediate complications. Procedure:             Pre-Anesthesia Assessment:                        - Prior to the procedure, a History and Physical was                         performed, and patient medications and allergies were                         reviewed. The patient's tolerance of previous                         anesthesia was also reviewed. The risks and benefits                         of the procedure and the sedation options and risks                         were discussed with the patient. All questions were                         answered, and informed consent was obtained. Prior                         Anticoagulants: The patient has taken no anticoagulant                         or antiplatelet agents. ASA Grade Assessment: III - A                         patient with severe systemic disease. After reviewing                         the risks and benefits, the patient was deemed in                         satisfactory condition to undergo the procedure.                        After obtaining informed consent, the endoscope was                         passed under direct vision. Throughout the procedure,  the patient's blood pressure, pulse, and oxygen                         saturations were monitored continuously. The Endoscope                         was introduced through the mouth, and advanced to the                          second part of duodenum. The upper GI endoscopy was                         accomplished without difficulty. The patient tolerated                         the procedure well. Findings:      Grade III varices were found in the lower third of the esophagus. Five       bands were successfully placed with incomplete eradication of varices.       There was no bleeding during the procedure.      Type 2 gastroesophageal varices (GOV2, esophageal varices which extend       along the fundus) with stigmata of prior treatment and no bleeding were       found in the gastric fundus. There were stigmata of recent bleeding.      The examined duodenum was normal. Impression:            - Grade III esophageal varices. Incompletely                         eradicated. Banded.                        - Type 2 gastroesophageal varices (GOV2, esophageal                         varices which extend along the fundus), previously                         treated and without bleeding.                        - Normal examined duodenum.                        - No specimens collected. Recommendation:        - Return patient to hospital ward for ongoing care.                        - Clear liquid diet.                        - Continue present medications.                        - Repeat upper endoscopy in 1 month for retreatment.                        - If any further bleeding then consider a TIPS  procedure. Procedure Code(s):     --- Professional ---                        [phone], Esophagogastroduodenoscopy, flexible,                         transoral; with band ligation of esophageal/gastric                         varices Diagnosis Code(s):     --- Professional ---                        K92.1, Melena (includes Hematochezia)                        I85.00, Esophageal varices without bleeding                        I86.4, Gastric varices CPT copyright 2022 American  Medical Association. All rights reserved. The codes documented in this report are preliminary and upon coder review may  be revised to meet current compliance requirements. [name] MD, MD 10/25/2023 1:00:50 PM This report has been signed electronically. Number of Addenda: 0 Note Initiated On: 10/25/2023 12:24 PM Estimated Blood Loss:  Estimated blood loss: none.      [facility]

## 2023-10-25 NOTE — Anesthesia Postprocedure Evaluation (Signed)
 Anesthesia Post Note  Patient: Christian Fuentes  Procedure(s) Performed: ESOPHAGOGASTRODUODENOSCOPY (EGD) WITH PROPOFOL  ESOPHAGEAL BANDING  Patient location during evaluation: Endoscopy Anesthesia Type: General Level of consciousness: awake and alert Pain management: pain level controlled Vital Signs Assessment: post-procedure vital signs reviewed and stable Respiratory status: spontaneous breathing, nonlabored ventilation, respiratory function stable and patient connected to nasal cannula oxygen Cardiovascular status: blood pressure returned to baseline and stable Postop Assessment: no apparent nausea or vomiting Anesthetic complications: no   No notable events documented.   Last Vitals:  Vitals:   10/25/23 1329 10/25/23 1339  BP: (!) 140/78 124/81  Pulse: 77 76  Resp: 13 (!) 9  Temp:    SpO2: 100% 100%    Last Pain:  Vitals:   10/25/23 1339  TempSrc:   PainSc: 5                  Fairy POUR Nani Ingram

## 2023-10-26 ENCOUNTER — Encounter: Payer: Self-pay | Admitting: Gastroenterology

## 2023-10-26 DIAGNOSIS — I864 Gastric varices: Secondary | ICD-10-CM | POA: Diagnosis not present

## 2023-10-26 DIAGNOSIS — K922 Gastrointestinal hemorrhage, unspecified: Secondary | ICD-10-CM | POA: Diagnosis not present

## 2023-10-26 DIAGNOSIS — K7460 Unspecified cirrhosis of liver: Secondary | ICD-10-CM | POA: Diagnosis not present

## 2023-10-26 DIAGNOSIS — I8500 Esophageal varices without bleeding: Secondary | ICD-10-CM

## 2023-10-26 DIAGNOSIS — D6851 Activated protein C resistance: Secondary | ICD-10-CM | POA: Diagnosis not present

## 2023-10-26 DIAGNOSIS — I8511 Secondary esophageal varices with bleeding: Secondary | ICD-10-CM

## 2023-10-26 LAB — CBC WITH DIFFERENTIAL/PLATELET
Abs Immature Granulocytes: 0.04 10*3/uL (ref 0.00–0.07)
Basophils Absolute: 0 10*3/uL (ref 0.0–0.1)
Basophils Relative: 0 %
Eosinophils Absolute: 0.1 10*3/uL (ref 0.0–0.5)
Eosinophils Relative: 1 %
HCT: 22.8 % — ABNORMAL LOW (ref 39.0–52.0)
Hemoglobin: 7.6 g/dL — ABNORMAL LOW (ref 13.0–17.0)
Immature Granulocytes: 1 %
Lymphocytes Relative: 17 %
Lymphs Abs: 1.3 10*3/uL (ref 0.7–4.0)
MCH: 30.9 pg (ref 26.0–34.0)
MCHC: 33.3 g/dL (ref 30.0–36.0)
MCV: 92.7 fL (ref 80.0–100.0)
Monocytes Absolute: 0.5 10*3/uL (ref 0.1–1.0)
Monocytes Relative: 6 %
Neutro Abs: 5.7 10*3/uL (ref 1.7–7.7)
Neutrophils Relative %: 75 %
Platelets: 111 10*3/uL — ABNORMAL LOW (ref 150–400)
RBC: 2.46 MIL/uL — ABNORMAL LOW (ref 4.22–5.81)
RDW: 15.4 % (ref 11.5–15.5)
WBC: 7.6 10*3/uL (ref 4.0–10.5)
nRBC: 0 % (ref 0.0–0.2)

## 2023-10-26 LAB — BASIC METABOLIC PANEL
Anion gap: 6 (ref 5–15)
BUN: 41 mg/dL — ABNORMAL HIGH (ref 6–20)
CO2: 23 mmol/L (ref 22–32)
Calcium: 8.4 mg/dL — ABNORMAL LOW (ref 8.9–10.3)
Chloride: 111 mmol/L (ref 98–111)
Creatinine, Ser: 1.71 mg/dL — ABNORMAL HIGH (ref 0.61–1.24)
GFR, Estimated: 46 mL/min — ABNORMAL LOW (ref >60)
Glucose, Bld: 128 mg/dL — ABNORMAL HIGH (ref 70–99)
Potassium: 4.1 mmol/L (ref 3.5–5.1)
Sodium: 140 mmol/L (ref 135–145)

## 2023-10-26 LAB — MAGNESIUM: Magnesium: 2.5 mg/dL — ABNORMAL HIGH (ref 1.7–2.4)

## 2023-10-26 LAB — HEMOGLOBIN: Hemoglobin: 8.3 g/dL — ABNORMAL LOW (ref 13.0–17.0)

## 2023-10-26 NOTE — Plan of Care (Signed)
  Problem: Education: Goal: Knowledge of General Education information will improve Description: Including pain rating scale, medication(s)/side effects and non-pharmacologic comfort measures Outcome: Progressing   Problem: Clinical Measurements: Goal: Diagnostic test results will improve Outcome: Progressing   Problem: Coping: Goal: Level of anxiety will decrease Outcome: Progressing   Problem: Pain Management: Goal: General experience of comfort will improve Outcome: Progressing

## 2023-10-26 NOTE — Progress Notes (Cosign Needed)
 Transition of Care [facility]) - Inpatient Brief Assessment   Patient Details  Name: Christian Fuentes MRN: [id number] Date of Birth: 1966/01/17  Transition of Care [facility]) CM/SW Contact:    Sharniece Gibbon C Gaelyn Tukes, RN Phone Number: 10/26/2023, 2:08 PM   Clinical Narrative: TOC continuing to follow patient's progress throughout discharge planning.   Transition of Care Asessment: Insurance and Status: Insurance coverage has been reviewed Patient has primary care physician: Yes   Prior level of function:: independednt   Social Drivers of Health Review: SDOH reviewed no interventions necessary Readmission risk has been reviewed: Yes Transition of care needs: no transition of care needs at this time

## 2023-10-26 NOTE — Progress Notes (Signed)
       CROSS COVER NOTE  NAME: Christian Fuentes MRN: [id number] DOB : Feb 17, 1966 ATTENDING PHYSICIAN: Laurita Pillion, MD    Date of Service   10/26/2023   HPI/Events of Note   Reported bright red blood clots coughed up. Patient is post variceal banding procedure yestday. Has underlying blood cirrhosis and Factor v Leiden deficiency   Interventions   Assessment/Plan: Repeat hgb insignificant decrease 7.6 from 8.2. Patient asymptomatic feels like he just had to clear it ou of his airway and it feels clear now Continue to monitor. Page on call GI if any further bleeding occurs        Erminio LITTIE Cone NP [facility] Cross Cover 7pm-7am - check amion for availability Pager [phone]

## 2023-10-26 NOTE — Progress Notes (Addendum)
 Progress Note   Patient: Christian Fuentes FMW:[id number] DOB: [birth year] DOA: 10/24/2023     2 DOS: the patient was seen and examined on 10/26/2023   Brief hospital course: Christian Fuentes is a 58 y.o. 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  Patient also had a black stool later in the evening. Patient is seen by GI, placed on IV Protonix , octreotide .   EGD was performed on 1/9, showed grade 3 esophageal varices, incompletely eradicated, banded.  Type II gastroesophageal varices, treated and without bleeding.    Principal Problem:   GI bleed Active Problems:   Budd-Chiari syndrome (HCC)   CKD (chronic kidney disease), stage II   Hypertension, benign   Factor V Leiden (HCC)   Upper GI bleed   Melena   Esophageal and gastric varicose veins (HCC)   Assessment and Plan: *Acute upper GI bleed secondary to esophageal varices.  Eliquis  contributed to it. Acute blood loss anemia. Liver cirrhosis. Budd-Chiari syndrome. 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 Patient is seen by GI, planning for EGD.  Continue Protonix  and octreotide .  Continue Rocephin  for prophylaxis. EKG was performed and results as above.  Discussed with GI, patient had a drop of hemoglobin to 7.6 today, will continue current treatment continue to monitor hemoglobin.  Transfuse as needed.  Nosebleeding. Patient complaining of coughing up of fresh red earlier this morning.  In further questioning, patient states that he picked his nose, had a nosebleeding.  Then coughed up some blood.  This is not from the GI.  Nosebleeding has stopped at this time.   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 Baseline factor V Leyden deficiency with noted history of recurrent VTE on Eliquis  IVC filter in place Discussed with Dr. [name], also reviewed literature, anticoagulation following varices banding does not increase the risk of rebleeding or mortality.  However, given drop hemoglobin today, will prefer to start anticoagulation tomorrow, will start with heparin .   Acute kidney injury. Mild metabolic acidosis. Patient baseline GFR more than 60, worsening renal function from GI bleed.  Today 1.71, recheck BMP tomorrow.     Hypertension, benign Blood pressure medicine on hold due to GI bleed.      Subjective:  Has been nosebleeding this morning resulting in cough up small amount of blood clots.  No nausea vomiting. Had a mild left upper quadrant abdominal pain, improved this morning.  Physical Exam: Vitals:   10/25/23 2324 10/26/23 0409 10/26/23 0420 10/26/23 0428  BP: (!) 154/74 137/82 135/76   Pulse: 79 81 84   Resp: 18 (!) 21 20 19   Temp: 99 F (37.2 C) 98.7 F (37.1 C)    TempSrc:  Oral    SpO2: 100% 100%    Weight:      Height:       General exam: Appears calm and comfortable  Respiratory system: Clear to auscultation. Respiratory effort normal. Cardiovascular system: S1 & S2 heard, RRR. No JVD, murmurs, rubs, gallops or clicks. No pedal edema. Gastrointestinal system: Abdomen is nondistended, soft and nontender. No organomegaly or masses felt. Normal bowel sounds heard. Central nervous system: Alert and oriented. No focal neurological deficits. Extremities: Symmetric 5 x 5 power. Skin: No rashes, lesions or ulcers Psychiatry: Judgement and insight appear normal.  Mood & affect appropriate.    Data Reviewed:  Lab results reviewed.  Family Communication: None  Disposition: Status is: Inpatient Remains inpatient appropriate because: Severity of disease, IV treatment.     Time spent: 50 minutes  Author: Murvin Mana, MD 10/26/2023 11:06 AM  For  on call review [email or web address].

## 2023-10-26 NOTE — Progress Notes (Signed)
    [name], MD [facility]   [address], [address] [place], [place] [address] Phone: [phone] Fax : [phone]   Subjective: The patient had a slight drop in his hemoglobin.  The patient had banding of his esophageal varices yesterday.  He had some coughing up blood which she reports happens frequently when he has a nosebleed.  He has had no further sign of any lower GI bleeding such as black stools.  He states that the postprocedure pain he had from the bands in his esophagus has improved.   Objective: Vital signs in last 24 hours: Vitals:   10/26/23 0409 10/26/23 0420 10/26/23 0428 10/26/23 1117  BP: 137/82 135/76  (!) 143/81  Pulse: 81 84  73  Resp: (!) 21 20 19 17   Temp: 98.7 F (37.1 C)   98.2 F (36.8 C)  TempSrc: Oral     SpO2: 100%   100%  Weight:      Height:       Weight change:   Intake/Output Summary (Last 24 hours) at 10/26/2023 1246 Last data filed at 10/26/2023 1057 Gross per 24 hour  Intake 1869.15 ml  Output 1800 ml  Net 69.15 ml     Exam: Heart:: Regular rate and rhythm, S1S2 present, or without murmur or extra heart sounds Lungs: normal and clear to auscultation and percussion Abdomen: soft, nontender, normal bowel sounds   Lab Results: @LABTEST2 @ Micro Results: No results found for this or any previous visit (from the past 240 hours). Studies/Results: No results found. Medications: I have reviewed the patient's current medications. Scheduled Meds:  pantoprazole  (PROTONIX ) IV  40 mg Intravenous Q12H   sucralfate   1 g Oral TID   Continuous Infusions:  cefTRIAXone  (ROCEPHIN )  IV 2 g (10/26/23 0836)   octreotide  (SANDOSTATIN ) 500 mcg in sodium chloride  0.9 % 250 mL (2 mcg/mL) infusion 50 mcg/hr (10/26/23 0547)   PRN Meds:.ondansetron  **OR** ondansetron  (ZOFRAN ) IV   Assessment: Principal Problem:   GI bleed Active Problems:   Budd-Chiari syndrome (HCC)   CKD (chronic kidney disease), stage II   Hypertension, benign   Factor V  Leiden (HCC)   Upper GI bleed   Melena   Esophageal and gastric varicose veins (HCC)    Plan: The patient has advanced cirrhosis with gastric and esophageal varices.  The patient's EGD yesterday had banding of the esophageal varices with some report of coughing up blood which the patient states is common for him when he has a nosebleed.  The patient has no sign of any GI bleeding.  I have discussed the plan to restart anticoagulation with both pharmacy and the hospitalist since there is literature showing that it does not increase bleeding even after variceal banding.  It is likely that anticoagulation will be started with heparin  tomorrow if no further sign of bleeding is seen.   LOS: 2 days   [name] HAMMANS 10/26/2023, 12:46 PM Pager [phone] 7am-5pm  Check AMION for 5pm -7am coverage and on weekends

## 2023-10-26 NOTE — Plan of Care (Signed)

## 2023-10-27 DIAGNOSIS — D500 Iron deficiency anemia secondary to blood loss (chronic): Secondary | ICD-10-CM | POA: Diagnosis not present

## 2023-10-27 DIAGNOSIS — I8511 Secondary esophageal varices with bleeding: Secondary | ICD-10-CM

## 2023-10-27 DIAGNOSIS — D509 Iron deficiency anemia, unspecified: Secondary | ICD-10-CM | POA: Insufficient documentation

## 2023-10-27 DIAGNOSIS — D6851 Activated protein C resistance: Secondary | ICD-10-CM | POA: Diagnosis not present

## 2023-10-27 DIAGNOSIS — K922 Gastrointestinal hemorrhage, unspecified: Secondary | ICD-10-CM | POA: Diagnosis not present

## 2023-10-27 LAB — IRON AND TIBC
Iron: 16 ug/dL — ABNORMAL LOW (ref 45–182)
Saturation Ratios: 5 % — ABNORMAL LOW (ref 17.9–39.5)
TIBC: 314 ug/dL (ref 250–450)
UIBC: 298 ug/dL

## 2023-10-27 LAB — BASIC METABOLIC PANEL
Anion gap: 5 (ref 5–15)
BUN: 26 mg/dL — ABNORMAL HIGH (ref 6–20)
CO2: 26 mmol/L (ref 22–32)
Calcium: 8.3 mg/dL — ABNORMAL LOW (ref 8.9–10.3)
Chloride: 111 mmol/L (ref 98–111)
Creatinine, Ser: 1.51 mg/dL — ABNORMAL HIGH (ref 0.61–1.24)
GFR, Estimated: 53 mL/min — ABNORMAL LOW (ref >60)
Glucose, Bld: 103 mg/dL — ABNORMAL HIGH (ref 70–99)
Potassium: 4.1 mmol/L (ref 3.5–5.1)
Sodium: 142 mmol/L (ref 135–145)

## 2023-10-27 LAB — VITAMIN B12: Vitamin B-12: 832 pg/mL (ref 180–914)

## 2023-10-27 LAB — HEMOGLOBIN
Hemoglobin: 7.1 g/dL — ABNORMAL LOW (ref 13.0–17.0)
Hemoglobin: 7.8 g/dL — ABNORMAL LOW (ref 13.0–17.0)
Hemoglobin: 7.1 g/dL — ABNORMAL LOW (ref 13.0–17.0)

## 2023-10-27 LAB — FERRITIN: Ferritin: 8 ng/mL — ABNORMAL LOW (ref 24–336)

## 2023-10-27 MED ORDER — IRON SUCROSE 300 MG IVPB - SIMPLE MED
300.0000 mg | Freq: Once | Status: AC
  Administered 2023-10-27: 300 mg via INTRAVENOUS
  Filled 2023-10-27: qty 300

## 2023-10-27 NOTE — Plan of Care (Signed)
  Problem: Education: Goal: Knowledge of General Education information will improve Description Including pain rating scale, medication(s)/side effects and non-pharmacologic comfort measures Outcome: Progressing   Problem: Health Behavior/Discharge Planning: Goal: Ability to manage health-related needs will improve Outcome: Progressing   Problem: Clinical Measurements: Goal: Respiratory complications will improve Outcome: Progressing Goal: Cardiovascular complication will be avoided Outcome: Progressing   Problem: Activity: Goal: Risk for activity intolerance will decrease Outcome: Progressing   Problem: Nutrition: Goal: Adequate nutrition will be maintained Outcome: Progressing   Problem: Coping: Goal: Level of anxiety will decrease Outcome: Progressing   Problem: Elimination: Goal: Will not experience complications related to bowel motility Outcome: Progressing

## 2023-10-27 NOTE — Progress Notes (Signed)
 Progress Note   Patient: Christian Fuentes FMW:[id number] DOB: Nov 17, 1965 DOA: 10/24/2023     3 DOS: the patient was seen and examined on 10/27/2023   Brief hospital course: VIRGINIO ISIDORE is a 58 y.o. 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  Patient also had a black stool later in the evening. Patient is seen by GI, placed on IV Protonix , octreotide .   EGD was performed on 1/9, showed grade 3 esophageal varices, incompletely eradicated, banded.  Type II gastroesophageal varices, treated and without bleeding.    Principal Problem:   GI bleed Active Problems:   Budd-Chiari syndrome (HCC)   CKD (chronic kidney disease), stage II   Hypertension, benign   Factor V Leiden (HCC)   Upper GI bleed   Melena   Esophageal and gastric varicose veins (HCC)   Esophageal varices with bleeding in diseases classified elsewhere (HCC)   Iron  deficiency anemia   Assessment and Plan: *Acute upper GI bleed secondary to esophageal varices.  Eliquis  contributed to it. Liver cirrhosis. Budd-Chiari syndrome. 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 Acute blood loss anemia. Iron  deficient anemia. Patient is seen by GI, EGD results as above.  Continue Protonix  and octreotide .  Continue Rocephin  for prophylaxis. EGD was performed and results as above.   Patient hemoglobin dropped down to 7.1, iron  studies showed severe iron  deficiency, started IV iron .  Also check a B12 level. Recheck hemoglobin level, transfuse when hemoglobin less than 7. Patient no longer had any nausea vomiting, no bowel movement since EGD.   Nosebleeding. Patient complaining of coughing up of  fresh red earlier this morning.  In further questioning, patient states that he picked his nose, had a nosebleeding.  Then coughed up some blood.  This is not from the GI.  Nosebleeding has stopped at this time.     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 Baseline factor V Leyden deficiency with noted history of recurrent VTE on Eliquis  IVC filter in place Discussed with Dr. [name], also reviewed literature, anticoagulation following varices banding does not increase the risk of rebleeding or mortality.  However, patient hemoglobin further dropped to 7.1 today, will continue to hold anticoagulation.   Acute kidney injury. Mild metabolic acidosis. Patient baseline GFR more than 60, worsening renal function from GI bleed.  Continue to follow.     Hypertension, benign Blood pressure medicine on hold due to GI bleed.      Subjective:  Patient does not have any nausea vomiting, no black stools.  No dizziness.  Physical Exam: Vitals:   10/26/23 2038 10/27/23 0429 10/27/23 0731 10/27/23 1126  BP: 132/68 120/65 116/63 123/68  Pulse: 71 69 70 65  Resp: 18 18 18 18   Temp: 99.3 F (37.4 C) 98 F (36.7 C) 98.8 F (37.1 C) 98.2 F (36.8 C)  TempSrc: Oral Axillary Oral Oral  SpO2: 100% 100% 100% (!) 88%  Weight:      Height:       General exam: Appears calm and comfortable  Respiratory system: Clear to auscultation. Respiratory effort normal. Cardiovascular system: S1 & S2 heard, RRR. No JVD, murmurs, rubs, gallops or clicks. No pedal edema. Gastrointestinal system: Abdomen is nondistended, soft and nontender. No organomegaly or masses felt. Normal bowel sounds heard. Central  nervous system: Alert and oriented. No focal neurological deficits. Extremities: Symmetric 5 x 5 power. Skin: No rashes, lesions or ulcers Psychiatry: Judgement and insight appear normal. Mood & affect appropriate.    Data Reviewed:  Lab results reviewed.  Family Communication: None  Disposition: Status  is: Inpatient Remains inpatient appropriate because: Severity of disease, IV treatment.     Time spent: 35 minutes  Author: Murvin Mana, MD 10/27/2023 12:51 PM  For on call review [email or web address].

## 2023-10-28 DIAGNOSIS — I8501 Esophageal varices with bleeding: Secondary | ICD-10-CM

## 2023-10-28 DIAGNOSIS — I8511 Secondary esophageal varices with bleeding: Secondary | ICD-10-CM | POA: Diagnosis not present

## 2023-10-28 DIAGNOSIS — D6851 Activated protein C resistance: Secondary | ICD-10-CM | POA: Diagnosis not present

## 2023-10-28 DIAGNOSIS — K922 Gastrointestinal hemorrhage, unspecified: Secondary | ICD-10-CM | POA: Diagnosis not present

## 2023-10-28 DIAGNOSIS — D500 Iron deficiency anemia secondary to blood loss (chronic): Secondary | ICD-10-CM | POA: Diagnosis not present

## 2023-10-28 LAB — BASIC METABOLIC PANEL
Anion gap: 8 (ref 5–15)
BUN: 25 mg/dL — ABNORMAL HIGH (ref 6–20)
CO2: 21 mmol/L — ABNORMAL LOW (ref 22–32)
Calcium: 8 mg/dL — ABNORMAL LOW (ref 8.9–10.3)
Chloride: 107 mmol/L (ref 98–111)
Creatinine, Ser: 1.37 mg/dL — ABNORMAL HIGH (ref 0.61–1.24)
GFR, Estimated: 60 mL/min — ABNORMAL LOW (ref >60)
Glucose, Bld: 99 mg/dL (ref 70–99)
Potassium: 3.7 mmol/L (ref 3.5–5.1)
Sodium: 136 mmol/L (ref 135–145)

## 2023-10-28 LAB — HEMOGLOBIN
Hemoglobin: 7 g/dL — ABNORMAL LOW (ref 13.0–17.0)
Hemoglobin: 8.4 g/dL — ABNORMAL LOW (ref 13.0–17.0)

## 2023-10-28 LAB — MAGNESIUM: Magnesium: 2.4 mg/dL (ref 1.7–2.4)

## 2023-10-28 LAB — PREPARE RBC (CROSSMATCH)

## 2023-10-28 MED ORDER — BOOST / RESOURCE BREEZE PO LIQD CUSTOM
1.0000 | Freq: Three times a day (TID) | ORAL | Status: DC
Start: 2023-10-28 — End: 2023-10-30
  Administered 2023-10-28 – 2023-10-29 (×5): 1 via ORAL

## 2023-10-28 MED ORDER — PANTOPRAZOLE SODIUM 40 MG PO TBEC
40.0000 mg | DELAYED_RELEASE_TABLET | Freq: Two times a day (BID) | ORAL | Status: DC
  Administered 2023-10-28 – 2023-10-30 (×4): 40 mg via ORAL
  Filled 2023-10-28 (×4): qty 1

## 2023-10-28 MED ORDER — ACETAMINOPHEN 325 MG PO TABS
650.0000 mg | ORAL_TABLET | Freq: Once | ORAL | Status: AC
  Administered 2023-10-28: 650 mg via ORAL
  Filled 2023-10-28: qty 2

## 2023-10-28 MED ORDER — SODIUM CHLORIDE 0.9% IV SOLUTION: Freq: Once | INTRAVENOUS | Status: AC

## 2023-10-28 NOTE — Progress Notes (Signed)
 Progress Note   Patient: Christian Fuentes Christian Fuentes:[id number] DOB: [birth year] DOA: 10/24/2023     4 DOS: the patient was seen and examined on 10/28/2023   Brief hospital course: Christian Fuentes is a 58 y.o. 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  Patient also had a black stool later in the evening. Patient is seen by GI, placed on IV Protonix , octreotide .   EGD was performed on 1/9, showed grade 3 esophageal varices, incompletely eradicated, banded.  Type II gastroesophageal varices, treated and without bleeding. Hemoglobin dropped down to 7.01/12, received 1 unit PRBC.  Anticoagulation still on hold.    Principal Problem:   GI bleed Active Problems:   Budd-Chiari syndrome (HCC)   CKD (chronic kidney disease), stage II   Hypertension, benign   Factor V Leiden (HCC)   Upper GI bleed   Melena   Esophageal and gastric varicose veins (HCC)   Esophageal varices with bleeding in diseases classified elsewhere (HCC)   Iron  deficiency anemia   Assessment and Plan: *Acute upper GI bleed secondary to esophageal varices.  Eliquis  contributed to it. Liver cirrhosis. Budd-Chiari syndrome. 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 Acute blood loss anemia. Iron  deficient anemia. Patient is seen by GI, EGD results as above.  Continue Protonix  and octreotide .  Continue Rocephin  for prophylaxis. EGD was performed and results as above.   Patient hemoglobin dropped down to 7.1, iron  studies showed severe iron  deficiency, started IV iron .  B12 normal. Hemoglobin dropped to 7.0, transfuse 1 unit PRBC.  However, patient has not had a bowel movement or nausea vomiting, do  not believe patient is active bleeding.  Will continue to follow closely.   Nosebleeding. Patient complaining of coughing up of fresh red earlier this morning.  In further questioning, patient states that he picked his nose, had a nosebleeding.  Then coughed up some blood.  This is not from the GI.  Nosebleeding has stopped at this time.     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 Baseline factor V Leyden deficiency with noted history of recurrent VTE on Eliquis  IVC filter in place Discussed with Dr. [name], also reviewed literature, anticoagulation following varices banding does not increase the risk of rebleeding or mortality.  Due to blood transfusion today, will continue hold anticoagulation.  Will need to start heparin  if hemoglobin starts stabilizing tomorrow.   Acute kidney injury. Mild metabolic acidosis. Patient baseline GFR more than 60, worsening renal function from GI bleed.  Continue to follow.     Hypertension, benign Blood pressure medicine on hold due to GI bleed.        Subjective:  Patient feels well today, no nausea vomiting, has not had a bowel movement.  Does not feel constipated.  Physical Exam: Vitals:   10/27/23 2046 10/28/23 0039 10/28/23 0421 10/28/23 0845  BP: 107/60 (!) 97/55 (!) 92/54 (!) 100/58  Pulse: 70 70 67 65  Resp: 20 18 20 17   Temp: 98.5 F (36.9 C) 98.2 F (36.8 C) 98.1 F (36.7 C) 98.4 F (36.9 C)  TempSrc: Oral     SpO2: 100% 100% 100% 100%  Weight:      Height:       General exam: Appears calm and comfortable  Respiratory system: Clear to  auscultation. Respiratory effort normal. Cardiovascular system: S1 & S2 heard, RRR. No JVD, murmurs, rubs, gallops or clicks. No pedal edema. Gastrointestinal system: Abdomen is nondistended, soft and nontender. No organomegaly or masses felt. Normal bowel sounds heard. Central nervous system: Alert and oriented. No focal neurological deficits. Extremities: Symmetric 5 x 5 power. Skin: No  rashes, lesions or ulcers Psychiatry: Judgement and insight appear normal. Mood & affect appropriate.    Data Reviewed:  Lab results reviewed.  Family Communication: None  Disposition: Status is: Inpatient Remains inpatient appropriate because: Severity of disease.     Time spent: 35 minutes  Author: Murvin Mana, MD 10/28/2023 11:15 AM  For on call review [email or web address].

## 2023-10-28 NOTE — Plan of Care (Signed)

## 2023-10-28 NOTE — Progress Notes (Signed)
 [name], MD [address]  [address]  [place], [place] [address]  Main: [phone]  Fax: [phone] Pager: [phone]   Subjective: Patient reports feeling hungry, denies any melena, rectal bleeding, denies any abdominal pain, nausea or vomiting He did not have any episodes of epistaxis within last 24 hours   Objective: Vital signs in last 24 hours: Vitals:   10/27/23 2046 10/28/23 0039 10/28/23 0421 10/28/23 0845  BP: 107/60 (!) 97/55 (!) 92/54 (!) 100/58  Pulse: 70 70 67 65  Resp: 20 18 20 17   Temp: 98.5 F (36.9 C) 98.2 F (36.8 C) 98.1 F (36.7 C) 98.4 F (36.9 C)  TempSrc: Oral     SpO2: 100% 100% 100% 100%  Weight:      Height:       Weight change:   Intake/Output Summary (Last 24 hours) at 10/28/2023 1206 Last data filed at 10/28/2023 0525 Gross per 24 hour  Intake 609.48 ml  Output 400 ml  Net 209.48 ml     Exam: Heart: Regular rate and rhythm, S1S2 present, or without murmur or extra heart sounds Lungs: normal and clear to auscultation Abdomen: soft, nontender, normal bowel sounds   Lab Results:    Latest Ref Rng & Units 10/28/2023    5:32 AM 10/27/2023    4:37 PM 10/27/2023    1:09 PM  CBC  Hemoglobin 13.0 - 17.0 g/dL 7.0  7.1  7.8       Latest Ref Rng & Units 10/28/2023    5:32 AM 10/27/2023    4:34 AM 10/26/2023    4:53 AM  CMP  Glucose 70 - 99 mg/dL 99  896  871   BUN 6 - 20 mg/dL 25  26  41   Creatinine 0.61 - 1.24 mg/dL 8.62  8.48  8.28   Sodium 135 - 145 mmol/L 136  142  140   Potassium 3.5 - 5.1 mmol/L 3.7  4.1  4.1   Chloride 98 - 111 mmol/L 107  111  111   CO2 22 - 32 mmol/L 21  26  23    Calcium  8.9 - 10.3 mg/dL 8.0  8.3  8.4     Micro Results: No results found for this or any previous visit (from the past 240 hours). Studies/Results: No results found. Medications: I have reviewed the patient's current medications. Prior to Admission:  Medications Prior to Admission  Medication Sig Dispense Refill Last  Dose/Taking   apixaban  (ELIQUIS ) 5 MG TABS tablet Take 5 mg by mouth 2 (two) times daily.   10/23/2023 Morning   amLODipine  (NORVASC ) 5 MG tablet Take 1 tablet (5 mg total) by mouth daily. (Patient not taking: Reported on 01/31/2018) 30 tablet 0    furosemide  (LASIX ) 40 MG tablet Take 1 tablet (40 mg total) by mouth daily. (Patient not taking: Reported on 10/24/2023) 30 tablet 6 Not Taking   nadolol  (CORGARD ) 40 MG tablet Take 1 tablet (40 mg total) by mouth daily. (Patient not taking: Reported on 10/24/2023) 30 tablet 6 Not Taking   pantoprazole  (PROTONIX ) 40 MG tablet Take 1 tablet by mouth daily. (Patient not taking: Reported on 10/24/2023)  0 Not Taking   potassium chloride  20 MEQ TBCR Take 20 mEq by mouth daily. (Patient not taking: Reported on 10/24/2023) 30 tablet 0 Not Taking   predniSONE  (DELTASONE ) 10 MG tablet 4 tablets once a day for 2 days, 3 tabs for 2 days, 2 tabs for 2 days and 1 tab for  2 days (Patient not taking: Reported on 10/24/2023) 20 tablet 0 Not Taking   salicylic acid  17 % gel Apply topically daily. (Patient not taking: Reported on 10/24/2023) 15 g 0 Not Taking   spironolactone  (ALDACTONE ) 100 MG tablet Take 1 tablet (100 mg total) by mouth daily. (Patient not taking: Reported on 10/24/2023) 30 tablet 6 Not Taking   traMADol  (ULTRAM ) 50 MG tablet Take 1 tablet (50 mg total) by mouth every 6 (six) hours as needed. (Patient not taking: Reported on 10/24/2023) 12 tablet 0 Not Taking   Scheduled:  sodium chloride    Intravenous Once   feeding supplement  1 Container Oral TID BM   pantoprazole   40 mg Oral BID AC   sucralfate   1 g Oral TID   Continuous:  octreotide  (SANDOSTATIN ) 500 mcg in sodium chloride  0.9 % 250 mL (2 mcg/mL) infusion 50 mcg/hr (10/28/23 1109)   PRN:ondansetron  **OR** ondansetron  (ZOFRAN ) IV Anti-infectives (From admission, onward)    Start     Dose/Rate Route Frequency Ordered Stop   10/25/23 0900  cefTRIAXone  (ROCEPHIN ) 2 g in sodium chloride  0.9 % 100 mL IVPB        2  g 200 mL/hr over 30 Minutes Intravenous Every 24 hours 10/24/23 1541 10/28/23 1146   10/24/23 0900  cefTRIAXone  (ROCEPHIN ) 1 g in sodium chloride  0.9 % 100 mL IVPB        1 g 200 mL/hr over 30 Minutes Intravenous  Once 10/24/23 0858 10/24/23 0955      Scheduled Meds:  sodium chloride    Intravenous Once   feeding supplement  1 Container Oral TID BM   pantoprazole   40 mg Oral BID AC   sucralfate   1 g Oral TID   Continuous Infusions:  octreotide  (SANDOSTATIN ) 500 mcg in sodium chloride  0.9 % 250 mL (2 mcg/mL) infusion 50 mcg/hr (10/28/23 1109)   PRN Meds:.ondansetron  **OR** ondansetron  (ZOFRAN ) IV   Assessment: Principal Problem:   GI bleed Active Problems:   Budd-Chiari syndrome (HCC)   CKD (chronic kidney disease), stage II   Hypertension, benign   Factor V Leiden (HCC)   Upper GI bleed   Melena   Esophageal and gastric varicose veins (HCC)   Esophageal varices with bleeding in diseases classified elsewhere [facility])   Iron  deficiency anemia  58 year old male with Budd-Chiari syndrome from factor V Leyden, portal hypertension resulting in esophageal and gastric varices, status post BATO at [facility] in the past presented with melena and anemia status post EGD on 1/9 with large esophageal varices s/p variceal ligation, presence of gastric varices with prior treatment and stigmata of recent bleeding  Plan: Acute upper GI bleed resulting in severe iron  deficiency anemia secondary to bleeding from esophageal varices status post EGD on 1/9, ligation of esophageal varices x 5 Receiving parenteral iron  for severe iron  deficiency in setting of acute GI bleed S/p octreotide  and ceftriaxone  for 4 days Continue Protonix  40 mg p.o. twice daily to prevent post variceal banding ulcer prophylaxis Low-sodium diet Protein supplements boost or Ensure Although, patient is not actively bleeding, his hemoglobin is at 7, 1 unit of PRBCs has been ordered for today If patient rebleeds, he will need to be  transferred to tertiary care center for repeat BRTO or evaluate for TIPS. Patient had last office visit with transplant hepatology at [facility] on 11/14/2022 Follow-up with [facility] transplant hepatology upon discharge Unclear if patient was on nonselective beta-blocker as outpatient  Factor V Leyden deficiency Recommend to start heparin  drip tomorrow after blood transfusion  and if no further episodes of bleeding, okay to resume Eliquis   Dr. [name] will resume care from tomorrow    LOS: 4 days   Shakeerah Gradel 10/28/2023, 12:06 PM

## 2023-10-29 DIAGNOSIS — D6851 Activated protein C resistance: Secondary | ICD-10-CM | POA: Diagnosis not present

## 2023-10-29 DIAGNOSIS — I864 Gastric varices: Secondary | ICD-10-CM | POA: Diagnosis not present

## 2023-10-29 DIAGNOSIS — I8500 Esophageal varices without bleeding: Secondary | ICD-10-CM | POA: Diagnosis not present

## 2023-10-29 DIAGNOSIS — D500 Iron deficiency anemia secondary to blood loss (chronic): Secondary | ICD-10-CM | POA: Diagnosis not present

## 2023-10-29 DIAGNOSIS — I8511 Secondary esophageal varices with bleeding: Secondary | ICD-10-CM | POA: Diagnosis not present

## 2023-10-29 LAB — BASIC METABOLIC PANEL
Anion gap: 9 (ref 5–15)
BUN: 27 mg/dL — ABNORMAL HIGH (ref 6–20)
CO2: 22 mmol/L (ref 22–32)
Calcium: 7.9 mg/dL — ABNORMAL LOW (ref 8.9–10.3)
Chloride: 106 mmol/L (ref 98–111)
Creatinine, Ser: 1.54 mg/dL — ABNORMAL HIGH (ref 0.61–1.24)
GFR, Estimated: 52 mL/min — ABNORMAL LOW (ref >60)
Glucose, Bld: 95 mg/dL (ref 70–99)
Potassium: 4 mmol/L (ref 3.5–5.1)
Sodium: 137 mmol/L (ref 135–145)

## 2023-10-29 LAB — BPAM RBC
Blood Product Expiration Date: 202502072359
ISSUE DATE / TIME: 202501121214
Unit Type and Rh: 6200

## 2023-10-29 LAB — TYPE AND SCREEN
ABO/RH(D): A POS
Antibody Screen: NEGATIVE
Unit division: 0

## 2023-10-29 LAB — PROTIME-INR
INR: 1.3 — ABNORMAL HIGH (ref 0.8–1.2)
Prothrombin Time: 16.2 s — ABNORMAL HIGH (ref 11.4–15.2)

## 2023-10-29 LAB — HEPARIN LEVEL (UNFRACTIONATED)
Heparin Unfractionated: 0.31 [IU]/mL (ref 0.30–0.70)
Heparin Unfractionated: 0.4 [IU]/mL (ref 0.30–0.70)

## 2023-10-29 LAB — HEMOGLOBIN
Hemoglobin: 7.8 g/dL — ABNORMAL LOW (ref 13.0–17.0)
Hemoglobin: 8 g/dL — ABNORMAL LOW (ref 13.0–17.0)
Hemoglobin: 7.8 g/dL — ABNORMAL LOW (ref 13.0–17.0)

## 2023-10-29 LAB — APTT: aPTT: 34 s (ref 24–36)

## 2023-10-29 LAB — MAGNESIUM: Magnesium: 2.3 mg/dL (ref 1.7–2.4)

## 2023-10-29 MED ORDER — SENNOSIDES-DOCUSATE SODIUM 8.6-50 MG PO TABS
2.0000 | ORAL_TABLET | Freq: Two times a day (BID) | ORAL | Status: DC
  Administered 2023-10-29 – 2023-10-30 (×2): 2 via ORAL
  Filled 2023-10-29 (×3): qty 2

## 2023-10-29 MED ORDER — HEPARIN (PORCINE) 25000 UT/250ML-% IV SOLN
1200.0000 [IU]/h | INTRAVENOUS | Status: DC
Start: 2023-10-29 — End: 2023-10-30
  Administered 2023-10-29 – 2023-10-30 (×2): 1200 [IU]/h via INTRAVENOUS
  Filled 2023-10-29 (×2): qty 250

## 2023-10-29 MED ORDER — IRON SUCROSE 300 MG IVPB - SIMPLE MED
300.0000 mg | Freq: Once | Status: AC
  Administered 2023-10-29: 300 mg via INTRAVENOUS
  Filled 2023-10-29: qty 300

## 2023-10-29 NOTE — Consult Note (Signed)
 PHARMACY - ANTICOAGULATION CONSULT NOTE  Pharmacy Consult for Heparin  Indication: Factor V Leiden with recurrent VTE  Allergies  Allergen Reactions   Diphenhydramine Hcl Nausea And Vomiting   Patient Measurements: Height: 5' 9 (175.3 cm) Weight: 73 kg (161 lb) IBW/kg (Calculated) : 70.7 Heparin  Dosing Weight: 73 kg   Vital Signs: Temp: 99.1 F (37.3 C) (01/13 1513) Temp Source: Oral (01/13 1513) BP: 114/61 (01/13 1513) Pulse Rate: 71 (01/13 1513)  Labs: Recent Labs    10/27/23 0434 10/27/23 1309 10/28/23 0532 10/28/23 1753 10/29/23 0448 10/29/23 0907 10/29/23 1307 10/29/23 1534  HGB 7.1*   < > 7.0*   < > 7.8*  --  8.0*  --   APTT  --   --   --   --   --  34  --   --   LABPROT  --   --   --   --   --  16.2*  --   --   INR  --   --   --   --   --  1.3*  --   --   HEPARINUNFRC  --   --   --   --   --   --   --  0.31  CREATININE 1.51*  --  1.37*  --  1.54*  --   --   --    < > = values in this interval not displayed.   Estimated Creatinine Clearance: 52.3 mL/min (A) (by C-G formula based on SCr of 1.54 mg/dL (H)).  Medical History: Past Medical History:  Diagnosis Date   Cirrhosis (HCC)    Factor 5 Leiden mutation, heterozygous (HCC)    Pulmonary embolism (HCC)    Sarcoidosis    Medications:  PTA apixaban  - last dose taken 1/7 AM   Assessment: Christian Fuentes is a 58 year old male that presented with coffee ground emesis and black stool. 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 Last dose of apixaban  was taken 1/7 in the morning. EGD showed gastric and esophageal varices. Received 1 unit PRBCs yesterday due to hemoglobin of 7.1. Patient experienced some nose bleeding yesterday although this has resolved. Pharmacy has been  consulted for initiation and management of a heparin  infusion to prevent VTE.   Baseline labs: Hgb 7.8, aPTT and INR have been ordered.   Goal of Therapy:  Heparin  level 0.3-0.7 units/ml Monitor platelets by anticoagulation protocol: Yes   Labs:  01/13 1534 HL = 0.31; therapeutic x 1 on 1200 units/hr   Plan:  Will plan to be conservative with dosing and avoid adjustment boluses as well HL therapeutic x 1  Continue heparin  infusion at 1200 units/hr  Recheck HL in 6 hours to confirm rate  Monitor CBC daily and for s/sx of bleeding - hemoglobin Q8H checks ordered by MD   Evonnie Nieves, PharmD Pharmacy Resident  10/29/2023 4:01 PM

## 2023-10-29 NOTE — Consult Note (Addendum)
 PHARMACY - ANTICOAGULATION CONSULT NOTE  Pharmacy Consult for Heparin  Indication: Factor V Leiden with recurrent VTE  Allergies  Allergen Reactions   Diphenhydramine Hcl Nausea And Vomiting   Patient Measurements: Height: 5' 9 (175.3 cm) Weight: 73 kg (161 lb) IBW/kg (Calculated) : 70.7 Heparin  Dosing Weight: 73 kg   Vital Signs: Temp: 98.3 F (36.8 C) (01/13 0400) Temp Source: Oral (01/13 0400) BP: 105/56 (01/13 0809) Pulse Rate: 66 (01/13 0809)  Labs: Recent Labs    10/27/23 0434 10/27/23 1309 10/28/23 0532 10/28/23 1753 10/29/23 0448  HGB 7.1*   < > 7.0* 8.4* 7.8*  CREATININE 1.51*  --  1.37*  --  1.54*   < > = values in this interval not displayed.   Estimated Creatinine Clearance: 52.3 mL/min (A) (by C-G formula based on SCr of 1.54 mg/dL (H)).  Medical History: Past Medical History:  Diagnosis Date   Cirrhosis (HCC)    Factor 5 Leiden mutation, heterozygous (HCC)    Pulmonary embolism (HCC)    Sarcoidosis    Medications:  PTA apixaban  - last dose taken 1/7 AM   Assessment: Christian Fuentes is a 58 year old male that presented with coffee ground emesis and black stool. 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 Last dose of apixaban  was taken 1/7 in the morning. EGD showed gastric and esophageal varices. Received 1 unit PRBCs yesterday due to hemoglobin of 7.1. Patient experienced some nose bleeding yesterday although this has resolved. Pharmacy has been consulted for initiation and management of a heparin  infusion to prevent VTE.   Baseline labs: Hgb 7.8, aPTT and INR have been ordered.   Goal of Therapy:  Heparin  level 0.3-0.7 units/ml Monitor platelets by anticoagulation protocol: Yes   Plan:  Will avoid initial  bolus given recent GI bleed and low hemoglobin  Will plan to be conservative with dosing and avoid adjustment boluses as well  Start heparin  infusion at 1200 units/hr  Check HL 6 hours after initiation of infusion  Monitor CBC daily and for s/sx of bleeding - hemoglobin Q8H checks ordered by MD   Evonnie Nieves, PharmD Pharmacy Resident  10/29/2023 8:28 AM

## 2023-10-29 NOTE — Consult Note (Signed)
 PHARMACY - ANTICOAGULATION CONSULT NOTE  Pharmacy Consult for Heparin  Indication: Factor V Leiden with recurrent VTE  Allergies  Allergen Reactions   Diphenhydramine Hcl Nausea And Vomiting   Patient Measurements: Height: 5' 9 (175.3 cm) Weight: 73 kg (161 lb) IBW/kg (Calculated) : 70.7 Heparin  Dosing Weight: 73 kg   Vital Signs: Temp: 99.6 F (37.6 C) (01/13 2030) Temp Source: Oral (01/13 1513) BP: 114/61 (01/13 2030) Pulse Rate: 70 (01/13 2030)  Labs: Recent Labs    10/27/23 0434 10/27/23 1309 10/28/23 0532 10/28/23 1753 10/29/23 0448 10/29/23 0907 10/29/23 1307 10/29/23 1534 10/29/23 2112  HGB 7.1*   < > 7.0*   < > 7.8*  --  8.0*  --  7.8*  APTT  --   --   --   --   --  34  --   --   --   LABPROT  --   --   --   --   --  16.2*  --   --   --   INR  --   --   --   --   --  1.3*  --   --   --   HEPARINUNFRC  --   --   --   --   --   --   --  0.31 0.40  CREATININE 1.51*  --  1.37*  --  1.54*  --   --   --   --    < > = values in this interval not displayed.   Estimated Creatinine Clearance: 52.3 mL/min (A) (by C-G formula based on SCr of 1.54 mg/dL (H)).  Medical History: Past Medical History:  Diagnosis Date   Cirrhosis (HCC)    Factor 5 Leiden mutation, heterozygous (HCC)    Pulmonary embolism (HCC)    Sarcoidosis    Medications:  PTA apixaban  - last dose taken 1/7 AM   Assessment: Christian Fuentes is a 58 year old male that presented with coffee ground emesis and black stool. 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 Last dose of apixaban  was taken 1/7 in the morning. EGD showed gastric and esophageal varices. Received 1 unit PRBCs yesterday due to hemoglobin of 7.1. Patient experienced some nose bleeding yesterday  although this has resolved. Pharmacy has been consulted for initiation and management of a heparin  infusion to prevent VTE.   Baseline labs: Hgb 7.8, aPTT and INR have been ordered.   Goal of Therapy:  Heparin  level 0.3-0.7 units/ml Monitor platelets by anticoagulation protocol: Yes   Labs:  01/13 1534 HL = 0.31; therapeutic x 1 on 1200 units/hr  01/13 2112 HL = 0.40; therapeutic x 2   Plan:  Will plan to be conservative with dosing and avoid adjustment boluses as well HL therapeutic x 2 Continue heparin  infusion at 1200 units/hr  Recheck HL on 1/14 with AM labs.  Monitor CBC daily and for s/sx of bleeding - hemoglobin Q8H checks ordered by MD   Silver Selinda BIRCH, PharmD 10/29/2023 10:30 PM

## 2023-10-29 NOTE — Plan of Care (Signed)

## 2023-10-29 NOTE — Progress Notes (Signed)
    Christian Copping, MD [facility]   [address] [place], [place] [address] Phone: [phone] Fax : [phone]   Subjective: The patient denies any further sign of any GI bleeding.  He reports that he is not having any abdominal pain nausea vomiting black stools or bloody stools.  The patient was on octreotide  for variceal bleeding and had 5 bands placed on his varices in the esophagus.  The patient was also noted to have gastric varices.   Objective: Vital signs in last 24 hours: Vitals:   10/29/23 0400 10/29/23 0809 10/29/23 1135 10/29/23 1513  BP: (!) 98/57 (!) 105/56 (!) 119/59 114/61  Pulse: 63 66 65 71  Resp:   16 18  Temp: 98.3 F (36.8 C)  98.4 F (36.9 C) 99.1 F (37.3 C)  TempSrc: Oral  Oral Oral  SpO2: 100% 99% 99% 100%  Weight:      Height:       Weight change:   Intake/Output Summary (Last 24 hours) at 10/29/2023 1721 Last data filed at 10/29/2023 1141 Gross per 24 hour  Intake 444.22 ml  Output 450 ml  Net -5.78 ml     Exam: Heart:: Regular rate and rhythm or without murmur or extra heart sounds Lungs: normal and clear to auscultation and percussion Abdomen: soft, nontender, normal bowel sounds   Lab Results: @LABTEST2 @ Micro Results: No results found for this or any previous visit (from the past 240 hours). Studies/Results: No results found. Medications: I have reviewed the patient's current medications. Scheduled Meds:  feeding supplement  1 Container Oral TID BM   pantoprazole   40 mg Oral BID AC   senna-docusate  2 tablet Oral BID   sucralfate   1 g Oral TID   Continuous Infusions:  heparin  1,200 Units/hr (10/29/23 0912)   PRN Meds:.ondansetron  **OR** ondansetron  (ZOFRAN ) IV   Assessment: Principal Problem:   GI bleed Active Problems:   Budd-Chiari syndrome (HCC)   CKD (chronic kidney disease), stage II   Hypertension, benign   Factor V Leiden (HCC)   Upper GI bleed   Melena   Esophageal and gastric varicose veins (HCC)    Esophageal varices with bleeding in diseases classified elsewhere (HCC)   Iron  deficiency anemia    Plan: Nothing further to do from a GI point of view during this admission.  The patient has been told that he should follow-up with his regular gastroenterologist for repeat EGD with banding for complete eradication for secondary prophylaxis for his varices.  The patient will contact his GI doctor at [facility] upon discharge.  I will sign off at this time.  If you have any other questions please do not hesitate to call.   LOS: 5 days   Christian Fuentes HAMMANS 10/29/2023, 5:21 PM Pager [phone] 7am-5pm  Check AMION for 5pm -7am coverage and on weekends

## 2023-10-29 NOTE — Progress Notes (Signed)
 Progress Note   Patient: Christian Fuentes FMW:[id number] DOB: Oct 10, 1966 DOA: 10/24/2023     5 DOS: the patient was seen and examined on 10/29/2023   Brief hospital course: Christian Fuentes is a 58 y.o. 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  Patient also had a black stool later in the evening. Patient is seen by GI, placed on IV Protonix , octreotide .   EGD was performed on 1/9, showed grade 3 esophageal varices, incompletely eradicated, banded.  Type II gastroesophageal varices, treated and without bleeding. Hemoglobin dropped down to 7.01/12, received 1 unit PRBC.  Anticoagulation still on hold.    Principal Problem:   GI bleed Active Problems:   Budd-Chiari syndrome (HCC)   CKD (chronic kidney disease), stage II   Hypertension, benign   Factor V Leiden (HCC)   Upper GI bleed   Melena   Esophageal and gastric varicose veins (HCC)   Esophageal varices with bleeding in diseases classified elsewhere [facility])   Iron  deficiency anemia   Assessment and Plan:  Acute upper GI bleed secondary to esophageal varices.  Eliquis  contributed to it. Liver cirrhosis. Budd-Chiari syndrome. 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 Acute blood loss anemia. Iron  deficient anemia. Patient is seen by GI, EGD results as above.  Continue Protonix  and octreotide .  Continue Rocephin  for prophylaxis. EGD was performed and results as above.   Patient hemoglobin dropped down to 7.1, iron  studies showed severe iron  deficiency, started IV iron .  B12 normal. Hemoglobin dropped to 7.0, transfuse 1 unit PRBC 1/12.   Hemoglobin 7.8 today, discussed with Dr. [name], patient does not  seem to have any active bleeding.  Heparin  is able to start.  I will give another dose of IV iron .  Continue to follow hemoglobin closely.  Completed 4 days octreotide , Protonix  also changed to oral.   Nosebleeding. Patient complaining of coughing up of fresh red earlier this morning.  In further questioning, patient states that he picked his nose, had a nosebleeding.  Then coughed up some blood.  This is not from the GI.  Nosebleeding has stopped at this time.     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 Baseline factor V Leyden deficiency with noted history of recurrent VTE on Eliquis  IVC filter in place Discussed with Dr. [name], also reviewed literature, anticoagulation following varices banding does not increase the risk of rebleeding or mortality.  Restarted heparin  today.   Acute kidney injury. Mild metabolic acidosis. Patient baseline GFR more than 60, worsening renal function from GI bleed.  Continue to follow.     Hypertension, benign Blood pressure medicine on hold due to GI bleed.     Subjective:  Patient feels well, no nausea vomiting, has not had a bowel movement for 3 days.  Added senna.  Physical Exam: Vitals:   10/28/23 2315 10/29/23 0400 10/29/23 0809 10/29/23 1135  BP: (!) 101/53 (!) 98/57 (!) 105/56 (!) 119/59  Pulse: 69 63 66 65  Resp: 17   16  Temp: 100 F (37.8 C) 98.3 F (36.8 C)  98.4 F (36.9 C)  TempSrc: Oral Oral  Oral  SpO2: 99% 100% 99% 99%  Weight:      Height:       General exam: Appears calm and comfortable  Respiratory system: Clear to auscultation. Respiratory effort normal. Cardiovascular system: S1 & S2 heard, RRR. No JVD, murmurs, rubs, gallops or clicks. No pedal edema. Gastrointestinal system: Abdomen is nondistended, soft and nontender. No organomegaly or masses felt. Normal bowel sounds heard. Central nervous system: Alert and oriented. No focal neurological deficits. Extremities: Symmetric 5 x 5 power. Skin: No rashes, lesions  or ulcers Psychiatry: Judgement and insight appear normal. Mood & affect appropriate.    Data Reviewed:  Lab results reviewed.  Family Communication: None  Disposition: Status is: Inpatient Remains inpatient appropriate because: Severity of disease, IV treatment.     Time spent: 35 minutes  Author: Murvin Mana, MD 10/29/2023 11:39 AM  For on call review [email or web address].

## 2023-10-29 NOTE — TOC Initial Note (Signed)
 Transition of Care [facility]) - Initial/Assessment Note    Patient Details  Name: Christian Fuentes MRN: [id number] Date of Birth: 1966-02-18  Transition of Care [facility]) CM/SW Contact:    Silvano Molt, LCSW Phone Number: 10/29/2023, 4:57 PM  Clinical Narrative:                  CSW completed high risk re-admission assessment w/ pt, as well as TOC assessment. Pt to return home w/ adult daughter. Pt has transportation to get back home, a PCP, and pharmacy are in tact. No further TOC needs at this time.        Patient Goals and CMS Choice Patient states their goals for this hospitalization and ongoing recovery are:: to start eating healthier          Expected Discharge Plan and Services       Living arrangements for the past 2 months: Apartment                                      Prior Living Arrangements/Services Living arrangements for the past 2 months: Apartment Lives with:: Adult Children Patient language and need for interpreter reviewed:: Yes Do you feel safe going back to the place where you live?: Yes      Need for Family Participation in Patient Care: Yes (Comment) Care giver support system in place?: Yes (comment)   Criminal Activity/Legal Involvement Pertinent to Current Situation/Hospitalization: No - Comment as needed  Activities of Daily Living   ADL Screening (condition at time of admission) Independently performs ADLs?: Yes (appropriate for developmental age) Is the patient deaf or have difficulty hearing?: No Does the patient have difficulty seeing, even when wearing glasses/contacts?: No Does the patient have difficulty concentrating, remembering, or making decisions?: No  Permission Sought/Granted            Permission granted to share info w Relationship: Daisy Scrobb     Emotional Assessment       Orientation: : Oriented to Self, Oriented to Place, Oriented to  Time, Oriented to Situation Alcohol / Substance Use: Not  Applicable Psych Involvement: No (comment)  Admission diagnosis:  GI bleed [K92.2] Upper GI bleed [K92.2] Patient Active Problem List   Diagnosis Date Noted   Iron  deficiency anemia 10/27/2023   Esophageal varices with bleeding in diseases classified elsewhere (HCC) 10/26/2023   Upper GI bleed 10/25/2023   Melena 10/25/2023   Esophageal and gastric varicose veins (HCC) 10/25/2023   GI bleed 10/24/2023   Factor V Leiden (HCC) 10/24/2023   Acute upper GI bleeding 04/18/2018   Cirrhosis of liver (HCC) 01/09/2018   Chronic cough 09/30/2017   Inguinal hernia 11/18/2016   Rectal bleeding 11/18/2016   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 [place] ([place]) [place] [address] Phone: [phone] Fax: [phone]  [facility] [address] [place] [address] Phone: [phone] Fax: [phone]     Social Drivers  of Health (SDOH) Social History: SDOH Screenings   Food Insecurity: No Food Insecurity (10/25/2023)  Housing: Low Risk  (10/25/2023)  Transportation Needs: No Transportation Needs (10/25/2023)  Utilities: Not At Risk (10/25/2023)  Financial Resource Strain: Low Risk  (11/27/2022)   Received from [facility], [facility]  Physical Activity: Inactive (01/10/2018)  Social Connections: Moderately Integrated (10/25/2023)  Stress: No Stress Concern Present (01/10/2018)  Tobacco Use: Low Risk  (10/24/2023)   SDOH Interventions:      Readmission Risk Interventions    10/29/2023    4:52 PM  Readmission Risk Prevention Plan  Transportation Screening Complete  PCP or Specialist Appt within 3-5 Days Complete  Social Work Consult for Recovery Care Planning/Counseling Complete  Palliative Care Screening Not Applicable  Medication Review Oceanographer) Complete

## 2023-10-30 DIAGNOSIS — K922 Gastrointestinal hemorrhage, unspecified: Secondary | ICD-10-CM | POA: Diagnosis not present

## 2023-10-30 DIAGNOSIS — D500 Iron deficiency anemia secondary to blood loss (chronic): Secondary | ICD-10-CM | POA: Diagnosis not present

## 2023-10-30 DIAGNOSIS — I8511 Secondary esophageal varices with bleeding: Secondary | ICD-10-CM | POA: Diagnosis not present

## 2023-10-30 DIAGNOSIS — D6851 Activated protein C resistance: Secondary | ICD-10-CM | POA: Diagnosis not present

## 2023-10-30 LAB — HEMOGLOBIN: Hemoglobin: 8 g/dL — ABNORMAL LOW (ref 13.0–17.0)

## 2023-10-30 LAB — HEPARIN LEVEL (UNFRACTIONATED): Heparin Unfractionated: 0.49 [IU]/mL (ref 0.30–0.70)

## 2023-10-30 MED ORDER — ORAL CARE MOUTH RINSE: 15.0000 mL | OROMUCOSAL | Status: DC | PRN

## 2023-10-30 MED ORDER — FERROUS SULFATE 325 (65 FE) MG PO TBEC: 325.0000 mg | DELAYED_RELEASE_TABLET | Freq: Every day | ORAL | 0 refills | Status: DC

## 2023-10-30 MED ORDER — PANTOPRAZOLE SODIUM 40 MG PO TBEC: 40.0000 mg | DELAYED_RELEASE_TABLET | Freq: Every day | ORAL | 0 refills | Status: DC

## 2023-10-30 NOTE — TOC Transition Note (Signed)
 Transition of Care [facility]) - Discharge Note   Patient Details  Name: Christian Fuentes MRN: [id number] Date of Birth: March 21, 1966  Transition of Care [facility]) CM/SW Contact:  Lauraine JAYSON Carpen, LCSW Phone Number: 10/30/2023, 9:40 AM   Clinical Narrative:  Patient has orders to discharge home today. No further concerns. CSW signing off.   Final next level of care: Home/Self Care Barriers to Discharge: Barriers Resolved   Patient Goals and CMS Choice Patient states their goals for this hospitalization and ongoing recovery are:: to start eating healthier          Discharge Placement                    Patient and family notified of of transfer: 10/30/23  Discharge Plan and Services Additional resources added to the After Visit Summary for                                       Social Drivers of Health (SDOH) Interventions SDOH Screenings   Food Insecurity: No Food Insecurity (10/25/2023)  Housing: Low Risk  (10/25/2023)  Transportation Needs: No Transportation Needs (10/25/2023)  Utilities: Not At Risk (10/25/2023)  Financial Resource Strain: Low Risk  (11/27/2022)   Received from [facility], [facility]  Physical Activity: Inactive (01/10/2018)  Social Connections: Moderately Integrated (10/25/2023)  Stress: No Stress Concern Present (01/10/2018)  Tobacco Use: Low Risk  (10/24/2023)     Readmission Risk Interventions    10/29/2023    4:52 PM  Readmission Risk Prevention Plan  Transportation Screening Complete  PCP or Specialist Appt within 3-5 Days Complete  Social Work Consult for Recovery Care Planning/Counseling Complete  Palliative Care Screening Not Applicable  Medication Review Oceanographer) Complete

## 2023-10-30 NOTE — Progress Notes (Signed)
 Patient called nursing station and wanted [place] to fax his work place the AVS and work note.    Sent fax to [facility] at [phone]

## 2023-10-30 NOTE — Plan of Care (Signed)

## 2023-10-30 NOTE — Consult Note (Signed)
 PHARMACY - ANTICOAGULATION CONSULT NOTE  Pharmacy Consult for Heparin  Indication: Factor V Leiden with recurrent VTE  Allergies  Allergen Reactions   Diphenhydramine Hcl Nausea And Vomiting   Patient Measurements: Height: 5' 9 (175.3 cm) Weight: 72 kg (158 lb 11.7 oz) IBW/kg (Calculated) : 70.7 Heparin  Dosing Weight: 73 kg   Vital Signs: Temp: 99.1 F (37.3 C) (01/14 0530) BP: 112/58 (01/14 0530) Pulse Rate: 67 (01/14 0530)  Labs: Recent Labs    10/28/23 0532 10/28/23 1753 10/29/23 0448 10/29/23 0907 10/29/23 1307 10/29/23 1534 10/29/23 2112 10/30/23 0521  HGB 7.0*   < > 7.8*  --    < >  --  7.8* 8.0*  APTT  --   --   --  34  --   --   --   --   LABPROT  --   --   --  16.2*  --   --   --   --   INR  --   --   --  1.3*  --   --   --   --   HEPARINUNFRC  --   --   --   --   --  0.31 0.40 0.49  CREATININE 1.37*  --  1.54*  --   --   --   --   --    < > = values in this interval not displayed.   Estimated Creatinine Clearance: 52.3 mL/min (A) (by C-G formula based on SCr of 1.54 mg/dL (H)).  Medical History: Past Medical History:  Diagnosis Date   Cirrhosis (HCC)    Factor 5 Leiden mutation, heterozygous (HCC)    Pulmonary embolism (HCC)    Sarcoidosis    Medications:  PTA apixaban  - last dose taken 1/7 AM   Assessment: Christian Fuentes is a 58 year old male that presented with coffee ground emesis and black stool. 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 Last dose of apixaban  was taken 1/7 in the morning. EGD showed gastric and esophageal varices. Received 1 unit PRBCs yesterday due to hemoglobin of 7.1. Patient experienced some nose bleeding yesterday although this has resolved. Pharmacy has been consulted for initiation and  management of a heparin  infusion to prevent VTE.   Baseline labs: Hgb 7.8, aPTT and INR have been ordered.   Goal of Therapy:  Heparin  level 0.3-0.7 units/ml Monitor platelets by anticoagulation protocol: Yes   Labs:  01/13 1534 HL = 0.31; therapeutic x 1 on 1200 units/hr  01/13 2112 HL = 0.40; therapeutic x 2  01/14 0521 HL = 0.49; therapeutic x 3   Plan:  Will plan to be conservative with dosing and avoid adjustment boluses as well HL therapeutic x 3 Continue heparin  infusion at 1200 units/hr  Recheck HL on 1/15 with AM labs.  Monitor CBC daily and for s/sx of bleeding - hemoglobin Q8H checks ordered by MD   Christian Fuentes, PharmD 10/30/2023 6:42 AM

## 2023-10-30 NOTE — Discharge Summary (Signed)
 Physician Discharge Summary   Patient: Christian Fuentes MRN: [id number] DOB: January 28, 1966  Admit date:     10/24/2023  Discharge date: 10/30/23  Discharge Physician: Murvin Mana   PCP: [name], MD   Recommendations at discharge:   Follow-up with PCP in 1 week. Also advised patient to call [facility] GI to be followed in 1 to 2 weeks.  Discharge Diagnoses: Principal Problem:   GI bleed Active Problems:   Budd-Chiari syndrome (HCC)   CKD (chronic kidney disease), stage II   Hypertension, benign   Factor V Leiden (HCC)   Upper GI bleed   Melena   Esophageal and gastric varicose veins (HCC)   Esophageal varices with bleeding in diseases classified elsewhere (HCC)   Iron  deficiency anemia  Resolved Problems:   * No resolved hospital problems. *  Hospital Course: Christian Fuentes is a 58 y.o. 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  Patient also had a black stool later in the evening. Patient is seen by GI, placed on IV Protonix , octreotide .   EGD was performed on 1/9, showed grade 3 esophageal varices, incompletely eradicated, banded.  Type II gastroesophageal varices, treated and without bleeding. Hemoglobin dropped down to 7.01/12, received 1 unit PRBC.  Anticoagulation still on hold. Patient was restarted on heparin  drip on 1/13 after stabilization of hemoglobin.  Hemoglobin still stable before discharge today, restarted Eliquis .   Assessment and Plan:  Acute upper GI bleed secondary to esophageal varices.  Eliquis  contributed to it. Liver cirrhosis. Budd-Chiari syndrome. 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 Acute blood loss anemia. Iron   deficient anemia. Patient is seen by GI, EGD results as above.  Continue Protonix  and octreotide .  Continue Rocephin  for prophylaxis. EGD was performed and results as above.   Patient hemoglobin dropped down to 7.1, iron  studies showed severe iron  deficiency, started IV iron .  B12 normal. Hemoglobin dropped to 7.0, transfuse 1 unit PRBC 1/12.   Hemoglobin 7.8 today, discussed with Dr. [name], patient does not seem to have any active bleeding.  Heparin  is able to start 1/13. Received another dose of IV iron .  So far patient has completed 4 days octreotide , Protonix  also changed to oral. Hemoglobin has been stabilized, no additional bleeding.  Medically stable for discharge.   Nosebleeding. Patient complaining of coughing up of fresh red earlier this morning.  In further questioning, patient states that he picked his nose, had a nosebleeding.  Then coughed up some blood.  This is not from the GI.  Nosebleeding has stopped at this time.     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 Baseline factor V Leyden deficiency with noted history of recurrent VTE on Eliquis  IVC filter in place Discussed with Dr. [name], also reviewed literature, anticoagulation following varices banding does not increase the risk of rebleeding or mortality.  Restart Eliquis  at discharge.   Acute kidney injury. Mild metabolic acidosis. Patient baseline GFR more than 60, worsening renal function from GI bleed.  Renal function has been relatively stable.     Hypertension, benign Blood pressure not significant elevated by in the hospital, patient was not taking his blood pressure medicine at home.  Will not restart      Consultants: GI Procedures performed: EGD  Disposition: Home Diet  recommendation:  Discharge Diet Orders (From admission, onward)     Start     Ordered   10/30/23 0000  Diet - low sodium heart healthy        10/30/23 0928           Cardiac diet DISCHARGE MEDICATION: Allergies as of 10/30/2023        Reactions   Diphenhydramine Hcl Nausea And Vomiting        Medication List     STOP taking these medications    amLODipine  5 MG tablet Commonly known as: NORVASC    furosemide  40 MG tablet Commonly known as: Lasix    nadolol  40 MG tablet Commonly known as: CORGARD    Potassium Chloride  ER 20 MEQ Tbcr   predniSONE  10 MG tablet Commonly known as: DELTASONE    salicylic acid  17 % gel   spironolactone  100 MG tablet Commonly known as: ALDACTONE    traMADol  50 MG tablet Commonly known as: Ultram        TAKE these medications    apixaban  5 MG Tabs tablet Commonly known as: ELIQUIS  Take 5 mg by mouth 2 (two) times daily.   ferrous sulfate  325 (65 FE) MG EC tablet Take 1 tablet (325 mg total) by mouth daily with breakfast.   pantoprazole  40 MG tablet Commonly known as: PROTONIX  Take 1 tablet (40 mg total) by mouth daily.        Follow-up Information     Straub, Michal SAUNDERS, MD Follow up in 1 week(s).   Specialty: Family Medicine Contact information: [address] [place] [address] [phone]                Discharge Exam: [name]   10/24/23 9157 10/30/23 0530  Weight: 73 kg 72 kg   General exam: Appears calm and comfortable  Respiratory system: Clear to auscultation. Respiratory effort normal. Cardiovascular system: S1 & S2 heard, RRR. No JVD, murmurs, rubs, gallops or clicks. No pedal edema. Gastrointestinal system: Abdomen is nondistended, soft and nontender. No organomegaly or masses felt. Normal bowel sounds heard. Central nervous system: Alert and oriented. No focal neurological deficits. Extremities: Symmetric 5 x 5 power. Skin: No rashes, lesions or ulcers Psychiatry: Judgement and insight appear normal. Mood & affect appropriate.    Condition at discharge: good  The results of significant diagnostics from this hospitalization (including imaging, microbiology, ancillary and laboratory) are listed below  for reference.   Imaging Studies: CT ANGIO GI BLEED Result Date: 10/24/2023 CLINICAL DATA:  58 year old male with cirrhosis, hematemesis, melena. Abdominal pain, weakness, legs feel hot. EXAM: CTA ABDOMEN AND PELVIS WITHOUT AND WITH CONTRAST TECHNIQUE: Multidetector CT imaging of the abdomen and pelvis was performed using the standard protocol during bolus administration of intravenous contrast. Multiplanar reconstructed images and MIPs were obtained and reviewed to evaluate the vascular anatomy. RADIATION DOSE REDUCTION: This exam was performed according to the departmental dose-optimization program which includes automated exposure control, adjustment of the mA and/or kV according to patient size and/or use of iterative reconstruction technique. CONTRAST:  OMNIPAQUE  IOHEXOL  350 MG/ML SOLN COMPARISON:  CT Abdomen and Pelvis 01/09/2018. FINDINGS: VASCULAR Chronic severe and enhancing esophageal and gastric varices. Superimposed gastric patent ligament varices. No contrast extravasation into the lumen of the upper gastrointestinal tract is identified, but sensitivity is decreased due to the bulky and enhanced [place]on varices. No contrast extravasation identified into the lumen of the small bowel or the colon. Normal caliber abdominal aorta with major arterial structures patent and no  significant atherosclerosis in the abdomen or pelvis. Chronically stented hepatic IVC and inferior cavoatrial junction. The stent is enhancing on the delayed phase and appears patent. However, the IVC just caudal to the stent appears atretic and largely nonenhancing on series 17, image 69, was more normal in 2019. The renal level IVC appears to remain patent. Portal venous system is patent. Review of the MIP images confirms the above findings. NON-VASCULAR Lower chest: Heart size remains normal. No pericardial effusion. Chronic inferior cavoatrial junction venous vascular stent which was present in 2019. Bulky and enhancing  esophageal varices also similar to the 2019 CT (series 7, image 8). No pleural effusion. Lung bases are clear. Hepatobiliary: Nodular and heterogeneous cirrhotic liver. Multiple liver masses cannot be excluded by CT. Gallbladder appears negative. Embolic material in the right hepatic lobe on series 17, image 62. Collapsed appearing vascular stent along the posterior liver might have been within hepatic vein on series 17, image 42. Chronic vascular stent within the hepatic IVC as above. Pancreas: Negative. Spleen: Splenomegaly has not significantly changed since 2019 and is mild-to-moderate. No discrete splenic lesion. Adrenals/Urinary Tract: Normal adrenal glands. Stable kidneys since 2019 with combined chronic partial renal atrophy and a degree of bilateral congenital renal malrotation. Kidneys appear nonobstructed. Nondilated ureters. Decompressed urinary bladder. Pelvic phleboliths. Stomach/Bowel: Retained stool in the rectum but otherwise decompressed large bowel. Herniated cecum, terminal ileum into the right inguinal canal, further detailed below. Small volume of simple density free fluid there also but no evidence of associated bowel obstruction or definite bowel inflammation. No dilated small bowel. Chronic severe gastric (series 17, image 46) and distal esophageal (series 17, image 23) enhancing varices. Stomach and duodenum are decompressed. No free air or free fluid. Lymphatic: No lymphadenopathy identified in the abdomen or pelvis. Reproductive: Chronic large right inguinal hernia, previously contained ascites and now contains bowel plus a small volume of simple appearing free fluid. This appears to be herniation of the cecum and the terminal ileum into the scrotum. And evidence that normal appendix is also herniated on series 20, image 25. The hernia now is at least 13 cm in length. Other: No pelvis free fluid. Musculoskeletal: No acute or suspicious osseous lesion identified. IMPRESSION: 1. Very severe  Cirrhosis (see #3), distal esophageal and gastric Varices. No active gastrointestinal bleeding by CTA, but sensitivity decreased due to the presence of bulky and enhancing mucosal varices. 2. Chronically stented hepatic IVC is patent, but the IVC just caudal to the liver appears atretic and segmentally occluded. The IVC is patent again at the level of the renal veins. 3. Progressively abnormal CT appearance of the liver since 2019. Underlying Liver mass(es) cannot be excluded. 4. Chronic large right inguinal hernia, now containing nonobstructed terminal ileum, cecum, and appendix. Small volume of ascites also within the hernia sac. But no strong evidence of hernia incarceration. 5. No other acute or inflammatory process identified. No atherosclerosis in the abdomen or pelvis. Electronically Signed   By: VEAR Hurst M.D.   On: 10/24/2023 10:51    Microbiology: Results for orders placed or performed during the hospital encounter of 05/17/18  Body fluid culture     Status: None   Collection Time: 05/17/18  1:00 PM   Specimen: PATH Cytology Peritoneal fluid  Result Value Ref Range Status   Specimen Description   Final    PERITONEAL Performed at [facility], [address], [place], [place] [address]    Special Requests   Final    NONE  Performed at [facility], [address] [place], [place] [address]    Gram Stain   Final    WBC PRESENT,BOTH PMN AND MONONUCLEAR NO ORGANISMS SEEN CYTOSPIN SMEAR    Culture   Final    NO GROWTH 3 DAYS Performed at [facility], [address] [place], [place] [address]    Report Status 05/21/2018 FINAL  Final    Labs: CBC: Recent Labs  Lab 10/24/23 0853 10/24/23 1722 10/24/23 2121 10/25/23 0449 10/25/23 1653 10/26/23 0453 10/26/23 1720 10/28/23 1753 10/29/23 0448 10/29/23 1307 10/29/23 2112 10/30/23 0521  WBC 16.6*  --   --  7.7  --  7.6  --   --   --   --   --   --   NEUTROABS  --   --   --   --   --  5.7  --   --    --   --   --   --   HGB 9.1* 8.5* 7.8* 8.0*   < > 7.6*   < > 8.4* 7.8* 8.0* 7.8* 8.0*  HCT 28.2* 26.1* 23.5* 24.2*  --  22.8*  --   --   --   --   --   --   MCV 93.7  --   --  94.9  --  92.7  --   --   --   --   --   --   PLT 167  --   --  100*  --  111*  --   --   --   --   --   --    < > = values in this interval not displayed.   Basic Metabolic Panel: Recent Labs  Lab 10/25/23 0449 10/26/23 0453 10/27/23 0434 10/28/23 0532 10/29/23 0448  NA 142 140 142 136 137  K 3.9 4.1 4.1 3.7 4.0  CL 115* 111 111 107 106  CO2 21* 23 26 21* 22  GLUCOSE 106* 128* 103* 99 95  BUN 55* 41* 26* 25* 27*  CREATININE 1.63* 1.71* 1.51* 1.37* 1.54*  CALCIUM  7.4* 8.4* 8.3* 8.0* 7.9*  MG  --  2.5*  --  2.4 2.3   Liver Function Tests: Recent Labs  Lab 10/24/23 0853 10/25/23 0449  AST 22 22  ALT 23 22  ALKPHOS 92 68  BILITOT 1.1 0.5  PROT 6.3* 5.0*  ALBUMIN 3.3* 2.6*   CBG: No results for input(s): GLUCAP in the last 168 hours.  Discharge time spent: greater than 30 minutes.  Signed: Murvin Mana, MD [facility] 10/30/2023

## 2023-12-03 ENCOUNTER — Telehealth: Payer: Self-pay

## 2023-12-03 NOTE — Telephone Encounter (Signed)
Left message on voicemail for pt to r/t my call to schedule repeat EGD, offered 3/4 and 3/6

## 2023-12-19 NOTE — Telephone Encounter (Signed)
 Left message on voicemail.

## 2023-12-21 NOTE — Telephone Encounter (Signed)
 Left message on voicemail.

## 2023-12-26 ENCOUNTER — Other Ambulatory Visit: Payer: Self-pay

## 2023-12-26 ENCOUNTER — Emergency Department
Admission: EM | Admit: 2023-12-26 | Discharge: 2023-12-26 | Disposition: A | Discharge status: Home / Self Care | Attending: Emergency Medicine | Admitting: Emergency Medicine

## 2023-12-26 ENCOUNTER — Emergency Department

## 2023-12-26 ENCOUNTER — Encounter: Payer: Self-pay | Admitting: Emergency Medicine

## 2023-12-26 DIAGNOSIS — R188 Other ascites: Secondary | ICD-10-CM | POA: Diagnosis not present

## 2023-12-26 DIAGNOSIS — R0781 Pleurodynia: Secondary | ICD-10-CM

## 2023-12-26 DIAGNOSIS — X500XXA Overexertion from strenuous movement or load, initial encounter: Secondary | ICD-10-CM | POA: Diagnosis not present

## 2023-12-26 DIAGNOSIS — S2231XA Fracture of one rib, right side, initial encounter for closed fracture: Secondary | ICD-10-CM

## 2023-12-26 DIAGNOSIS — S2241XA Multiple fractures of ribs, right side, initial encounter for closed fracture: Secondary | ICD-10-CM | POA: Diagnosis not present

## 2023-12-26 DIAGNOSIS — R0789 Other chest pain: Secondary | ICD-10-CM

## 2023-12-26 LAB — COMPREHENSIVE METABOLIC PANEL
ALT: 23 U/L (ref 0–44)
AST: 31 U/L (ref 15–41)
Albumin: 3.2 g/dL — ABNORMAL LOW (ref 3.5–5.0)
Alkaline Phosphatase: 125 U/L (ref 38–126)
Anion gap: 4 — ABNORMAL LOW (ref 5–15)
BUN: 26 mg/dL — ABNORMAL HIGH (ref 6–20)
CO2: 23 mmol/L (ref 22–32)
Calcium: 8.6 mg/dL — ABNORMAL LOW (ref 8.9–10.3)
Chloride: 111 mmol/L (ref 98–111)
Creatinine, Ser: 1.5 mg/dL — ABNORMAL HIGH (ref 0.61–1.24)
GFR, Estimated: 53 mL/min — ABNORMAL LOW (ref >60)
Glucose, Bld: 110 mg/dL — ABNORMAL HIGH (ref 70–99)
Potassium: 3.9 mmol/L (ref 3.5–5.1)
Sodium: 138 mmol/L (ref 135–145)
Total Bilirubin: 1.7 mg/dL — ABNORMAL HIGH (ref 0.0–1.2)
Total Protein: 6.7 g/dL (ref 6.5–8.1)

## 2023-12-26 LAB — PROTIME-INR
INR: 1.1 (ref 0.8–1.2)
Prothrombin Time: 14.9 s (ref 11.4–15.2)

## 2023-12-26 LAB — CBC WITH DIFFERENTIAL/PLATELET
Abs Immature Granulocytes: 0.01 10*3/uL (ref 0.00–0.07)
Basophils Absolute: 0 10*3/uL (ref 0.0–0.1)
Basophils Relative: 1 %
Eosinophils Absolute: 0.3 10*3/uL (ref 0.0–0.5)
Eosinophils Relative: 7 %
HCT: 26.1 % — ABNORMAL LOW (ref 39.0–52.0)
Hemoglobin: 7.9 g/dL — ABNORMAL LOW (ref 13.0–17.0)
Immature Granulocytes: 0 %
Lymphocytes Relative: 25 %
Lymphs Abs: 0.9 10*3/uL (ref 0.7–4.0)
MCH: 27.5 pg (ref 26.0–34.0)
MCHC: 30.3 g/dL (ref 30.0–36.0)
MCV: 90.9 fL (ref 80.0–100.0)
Monocytes Absolute: 0.3 10*3/uL (ref 0.1–1.0)
Monocytes Relative: 7 %
Neutro Abs: 2.3 10*3/uL (ref 1.7–7.7)
Neutrophils Relative %: 60 %
Platelets: 107 10*3/uL — ABNORMAL LOW (ref 150–400)
RBC: 2.87 MIL/uL — ABNORMAL LOW (ref 4.22–5.81)
RDW: 15.2 % (ref 11.5–15.5)
WBC: 3.8 10*3/uL — ABNORMAL LOW (ref 4.0–10.5)
nRBC: 0 % (ref 0.0–0.2)

## 2023-12-26 LAB — APTT: aPTT: 29 s (ref 24–36)

## 2023-12-26 LAB — AMMONIA: Ammonia: 16 umol/L (ref 9–35)

## 2023-12-26 LAB — LIPASE, BLOOD: Lipase: 33 U/L (ref 11–51)

## 2023-12-26 MED ORDER — METHOCARBAMOL 500 MG PO TABS: 1000.0000 mg | ORAL_TABLET | Freq: Once | ORAL | Status: DC

## 2023-12-26 NOTE — ED Provider Notes (Signed)
 [facility] Provider Note    Event Date/Time   First MD Initiated Contact with Patient 12/26/23 [phone]     (approximate)   History   Abdominal Pain   HPI Christian Fuentes is a 58 y.o. male with history of cirrhosis, esophageal varices presenting today for rib pain.  Patient states several weeks ago he was helping some friends move when he lifted a heavy object and felt a strain along the right side of his rib cage.  Has had intermittent pain since then.  The triage note stated he may have fallen but when I asked patient multiple times, he is adamant that he did not fall and hit his ribs.  Separately, he has noted some abdominal distention recently.  Has needed paracentesis in the past related to his cirrhosis.  Reports last 1 was several years ago.  Has not seen his cirrhosis team recently at [facility].  Otherwise denies any abdominal pain, nausea, vomiting, diarrhea, constipation, dysuria, hematuria.  States aside from the rib pain he has no acute symptoms.  No chest pain or difficulty breathing.     Physical Exam   Triage Vital Signs: ED Triage Vitals  Encounter Vitals Group     BP 12/26/23 0500 (!) 148/75     Systolic BP Percentile --      Diastolic BP Percentile --      Pulse Rate 12/26/23 0500 85     Resp 12/26/23 0500 18     Temp 12/26/23 0500 97.7 F (36.5 C)     Temp Source 12/26/23 0500 Oral     SpO2 12/26/23 0500 100 %     Weight 12/26/23 0503 170 lb (77.1 kg)     Height 12/26/23 0503 5\' 9"  (1.753 m)     Head Circumference --      Peak Flow --      Pain Score 12/26/23 0503 5     Pain Loc --      Pain Education --      Exclude from Growth Chart --     Most recent vital signs: Vitals:   12/26/23 0500 12/26/23 0644  BP: (!) 148/75   Pulse: 85 80  Resp: 18 18  Temp: 97.7 F (36.5 C)   SpO2: 100% 100%   Physical Exam: I have reviewed the vital signs and nursing notes. General: Awake, alert, no acute distress.  Nontoxic appearing. Head:   Atraumatic, normocephalic.   ENT:  EOM intact, PERRL. Oral mucosa is pink and moist with no lesions. Neck: Neck is supple with full range of motion, No meningeal signs. Cardiovascular:  RRR, No murmurs. Peripheral pulses palpable and equal bilaterally. Chest wall: Point tenderness over the right lateral rib cage.  Exacerbated with movement.  No tenderness elsewhere. Respiratory:  Symmetrical chest wall expansion.  No rhonchi, rales, or wheezes.  Good air movement throughout.  No use of accessory muscles.   Musculoskeletal:  No cyanosis or edema. Moving extremities with full ROM Abdomen:  Soft, nontender throughout the entire abdomen and explicitly in the right upper quadrant, mild distention. Neuro:  GCS 15, moving all four extremities, interacting appropriately. Speech clear. Psych:  Calm, appropriate.   Skin:  Warm, dry, no rash.    ED Results / Procedures / Treatments   Labs (all labs ordered are listed, but only abnormal results are displayed) Labs Reviewed  CBC WITH DIFFERENTIAL/PLATELET - Abnormal; Notable for the following components:      Result Value   WBC 3.8 (*)  RBC 2.87 (*)    Hemoglobin 7.9 (*)    HCT 26.1 (*)    Platelets 107 (*)    All other components within normal limits  COMPREHENSIVE METABOLIC PANEL - Abnormal; Notable for the following components:   Glucose, Bld 110 (*)    BUN 26 (*)    Creatinine, Ser 1.50 (*)    Calcium 8.6 (*)    Albumin 3.2 (*)    Total Bilirubin 1.7 (*)    GFR, Estimated 53 (*)    Anion gap 4 (*)    All other components within normal limits  LIPASE, BLOOD  AMMONIA  PROTIME-INR  APTT     EKG My EKG interpretation: Rate of 77, normal sinus rhythm, normal axis, normal intervals.  No acute ST elevations or depressions   RADIOLOGY Independently interpreted rib x-rays with no obvious abnormalities   PROCEDURES:  Critical Care performed: No  Procedures   MEDICATIONS ORDERED IN ED: Medications - No data to  display    IMPRESSION / MDM / ASSESSMENT AND PLAN / ED COURSE  I reviewed the triage vital signs and the nursing notes.                              Differential diagnosis includes, but is not limited to, ascites, rib fracture, rib hematoma, costochondritis  Patient's presentation is most consistent with acute complicated illness / injury requiring diagnostic workup.  Patient is a 58 year old male presenting today for abdominal distention as well as rib pain.  Regarding his rib pain, he has point tenderness to the right lateral rib cage in 1 specific spot.  No pain elsewhere.  Pain can be brought on by movement.  X-rays ordered to rule out any fracture as patient goes back and forth on whether he fell or not.  Separately, for the abdominal distention, he specifically has no abdominal tenderness anywhere.  Vital signs are stable.  No fevers, vomiting, or diarrhea.  CBC with no leukocytosis.  Ammonia level normal.  CMP shows slight T. bili elevation but has a history of this.  Specifically he has no right upper quadrant tenderness to palpation.  No concern for SBP or acute gallbladder issues.  Bedside ultrasound without any obvious gallbladder findings either.  He does have a small amount of ascites in the dependent regions of his flank but no indication for paracentesis in the ED.  I recommend patient follow-up with his [facility] team regarding his cirrhosis outpatient for ongoing management.  He states he will call them today to set up a follow-up appointment.  Offered patient medication to help with pain symptoms but he states he is okay at this time.  I do not see any obvious abnormalities on the x-ray but patient would like to wait until a formal read before going home.  If negative, can go home and follow-up with his [facility] cirrhosis team.     FINAL CLINICAL IMPRESSION(S) / ED DIAGNOSES   Final diagnoses:  Rib pain on right side  Other ascites     Rx / DC Orders   ED Discharge Orders     None         Note:  This document was prepared using Dragon voice recognition software and may include unintentional dictation errors.   Janith Lima, MD 12/26/23 [phone]

## 2023-12-26 NOTE — ED Notes (Signed)
 Incentive spirometer given to pt at discharge. Pt demonstrated proper use. No questions or concerns.

## 2023-12-26 NOTE — ED Triage Notes (Addendum)
 Patient ambulatory to triage with steady gait, without difficulty or distress noted; pt reports abd swelling since yesterday; denies abd pain, denies any accomp symptoms; pt also reports having some rt lateral rib pain after "helping some friends move and may have fallen"; pt is poor historian; according to chart, pt has hx cirrhosis and recent hosp for banding of esophageal varices

## 2023-12-26 NOTE — Discharge Instructions (Addendum)
 The x-ray shows you have a small fracture in one of the lower ribs on your right side. This will heal on its own. Avoid pressure on the painful area.  You can brace the area with your hand or a pillow when coughing to lessen pain.  Please call your [facility] team that treats your cirrhosis for ongoing outpatient monitoring today.  You have a small amount of fluid in your belly and if it continues to get larger, may need to have it drained again like in the past.  Otherwise, please return for any emergent symptoms.

## 2023-12-26 NOTE — ED Notes (Signed)
 Reviewed pt's c/o and hx with Dr Anner Crete, orders entered by MD

## 2023-12-26 NOTE — ED Notes (Signed)
 This RN gave report Christian Fuentes and performed bedside care handoff.

## 2023-12-31 ENCOUNTER — Telehealth: Payer: Self-pay

## 2023-12-31 ENCOUNTER — Other Ambulatory Visit: Payer: Self-pay

## 2023-12-31 DIAGNOSIS — I8511 Secondary esophageal varices with bleeding: Secondary | ICD-10-CM

## 2023-12-31 NOTE — Telephone Encounter (Signed)
 Clearance faxed to Straub, Elvis Coil, MD for procedure and blood thinner

## 2024-01-03 NOTE — Telephone Encounter (Signed)
Clearance faxed x 2 

## 2024-01-17 NOTE — Telephone Encounter (Signed)
 Clearance received.... Pt is to hold Eliquis 2 days prior and restart 1 day after

## 2024-01-24 ENCOUNTER — Ambulatory Visit: Admission: RE | Admit: 2024-01-24 | Source: Home / Self Care | Admitting: Gastroenterology

## 2024-01-24 ENCOUNTER — Encounter: Admission: RE | Payer: Self-pay | Source: Home / Self Care

## 2024-01-24 SURGERY — EGD (ESOPHAGOGASTRODUODENOSCOPY)
Anesthesia: General

## 2024-01-24 NOTE — Op Note (Addendum)
 [facility] Patient Name: Christian Fuentes Procedure Date: 01/24/2024 1:49 PM MRN: [id number] Account #: [id number] Date of Birth: 05-26-1966 Admit Type: Inpatient Age: 58 Room: [facility] ENDO ROOM 4 Gender: Male Note Status: Finalized Instrument Name: Upper Endoscope [id number] THIS EXAM WAS SENT IN ERROR

## 2024-02-04 ENCOUNTER — Encounter: Payer: Self-pay | Admitting: Gastroenterology

## 2024-02-04 ENCOUNTER — Other Ambulatory Visit: Payer: Self-pay | Admitting: Gastroenterology

## 2024-02-04 DIAGNOSIS — R188 Other ascites: Secondary | ICD-10-CM

## 2024-02-04 DIAGNOSIS — K7469 Other cirrhosis of liver: Secondary | ICD-10-CM

## 2024-02-05 ENCOUNTER — Ambulatory Visit
Admission: RE | Admit: 2024-02-05 | Discharge: 2024-02-05 | Disposition: A | Discharge status: Home / Self Care | Source: Ambulatory Visit | Attending: Gastroenterology | Admitting: Gastroenterology

## 2024-02-05 DIAGNOSIS — R188 Other ascites: Secondary | ICD-10-CM | POA: Insufficient documentation

## 2024-02-05 DIAGNOSIS — K7469 Other cirrhosis of liver: Secondary | ICD-10-CM | POA: Insufficient documentation

## 2024-02-05 LAB — PROTEIN, PLEURAL OR PERITONEAL FLUID: Total protein, fluid: <3 g/dL

## 2024-02-05 LAB — BODY FLUID CELL COUNT WITH DIFFERENTIAL
Eos, Fluid: 0 %
Lymphs, Fluid: 28 %
Monocyte-Macrophage-Serous Fluid: 64 %
Neutrophil Count, Fluid: 8 %
Total Nucleated Cell Count, Fluid: 184 uL

## 2024-02-05 LAB — ALBUMIN, PLEURAL OR PERITONEAL FLUID: Albumin, Fluid: <1.5 g/dL

## 2024-02-05 MED ORDER — LIDOCAINE HCL (PF) 1 % IJ SOLN
15.0000 mL | Freq: Once | INTRAMUSCULAR | Status: AC
  Administered 2024-02-05: 15 mL via INTRADERMAL
  Filled 2024-02-05: qty 15

## 2024-02-05 NOTE — Discharge Instructions (Signed)
 Reviewed with patient

## 2024-02-05 NOTE — Procedures (Signed)
 PROCEDURE SUMMARY:  Successful image-guided paracentesis from the right abdomen.  Yielded 2 liters of clear, yellow fluid.  No immediate complications.  EBL: zero Patient tolerated well.   Specimen sent for labs.  Please see imaging section of Epic for full dictation.  Sabri Teal NP 02/05/2024 11:38 AM

## 2024-02-06 LAB — PATHOLOGIST SMEAR REVIEW: Path Review: REACTIVE

## 2024-02-08 LAB — BODY FLUID CULTURE W GRAM STAIN: Gram Stain: NONE SEEN

## 2024-02-09 ENCOUNTER — Emergency Department
Admission: EM | Admit: 2024-02-09 | Discharge: 2024-02-09 | Disposition: A | Discharge status: Home / Self Care | Source: Home / Self Care | Attending: Emergency Medicine | Admitting: Emergency Medicine

## 2024-02-09 ENCOUNTER — Emergency Department

## 2024-02-09 DIAGNOSIS — J069 Acute upper respiratory infection, unspecified: Secondary | ICD-10-CM | POA: Insufficient documentation

## 2024-02-09 DIAGNOSIS — R140 Abdominal distension (gaseous): Secondary | ICD-10-CM | POA: Insufficient documentation

## 2024-02-09 DIAGNOSIS — K7460 Unspecified cirrhosis of liver: Secondary | ICD-10-CM | POA: Diagnosis not present

## 2024-02-09 DIAGNOSIS — K920 Hematemesis: Secondary | ICD-10-CM | POA: Diagnosis not present

## 2024-02-09 MED ORDER — BENZONATATE 100 MG PO CAPS
100.0000 mg | ORAL_CAPSULE | Freq: Three times a day (TID) | ORAL | 0 refills | Status: DC | PRN
Start: 2024-02-09 — End: 2025-02-08

## 2024-02-09 MED ORDER — PREDNISONE 10 MG (21) PO TBPK: ORAL_TABLET | ORAL | 0 refills | Status: DC

## 2024-02-09 NOTE — Discharge Instructions (Signed)
 Follow-up with your regular doctor if not improving in 2 to 3 days.  Return emergency department if worsening.  Continue all of your regular medications.  If you start to become short of breath or have chest pain please return emergency department immediately.

## 2024-02-09 NOTE — ED Triage Notes (Signed)
 Pt here POV for c/c of cough/mucous. Patient was treated approx 2 weeks ago for pneumonia including abx and steroids. Patient states he feels like the pneumonia is still present due to persistent cough and mucous production. Denies fever.

## 2024-02-09 NOTE — ED Provider Notes (Signed)
 [facility] Provider Note    Event Date/Time   First MD Initiated Contact with Patient 02/09/24 1337     (approximate)   History   Cough   HPI  Christian Fuentes is a 58 y.o. 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  Patient states he feels like he has gotten more fluid on his abdomen.  States that they took off about a pint of fluid last week.  Was seen in urgent care about a week ago and had a chest x-ray reportedly said they thought he had pneumonia.  Placed him on Augmentin, doxycycline, and Bromfed cough syrup.  Continues to have a cough.  Sometimes feels short of breath but not on exertion.  No vomiting/diarrhea.  No fever or chills      Physical Exam   Triage Vital Signs: ED Triage Vitals  Encounter Vitals Group     BP 02/09/24 1331 (!) 155/89     Systolic BP Percentile --      Diastolic BP Percentile --      Pulse Rate 02/09/24 1331 98     Resp 02/09/24 1331 (!) 24     Temp 02/09/24 1331 98 F (36.7 C)     Temp Source 02/09/24 1331 Oral     SpO2 02/09/24 1331 97 %     Weight 02/09/24 1332 160 lb (72.6 kg)     Height 02/09/24 1332 5\' 9"  (1.753 m)     Head Circumference --      Peak Flow --      Pain Score 02/09/24 1332 0     Pain Loc --      Pain Education --      Exclude from Growth Chart --     Most recent vital signs: Vitals:   02/09/24 1331  BP: (!) 155/89  Pulse: 98  Resp: (!) 24  Temp: 98 F (36.7 C)  SpO2: 97%     General: Awake, no distress.   CV:  Good peripheral perfusion. regular rate and  rhythm Resp:  Normal effort.  Abd:    Abdomen is distended with ascites, nontender Other:      ED Results / Procedures / Treatments   Labs (all labs ordered are listed, but only abnormal results are displayed) Labs Reviewed - No data to display   EKG     RADIOLOGY Chest x-ray    PROCEDURES:   Procedures Chief  Complaint  Patient presents with   Cough      MEDICATIONS ORDERED IN ED: Medications - No data to display   IMPRESSION / MDM / ASSESSMENT AND PLAN / ED COURSE  I reviewed the triage vital signs and the nursing notes.                              Differential diagnosis includes, but is not limited to, cough secondary to vascular congestion, acute bronchitis, CHF, ascites, PE  Patient's presentation is most consistent with acute illness / injury with system symptoms.   Chest x-ray was independently reviewed interpreted by me as being negative for acute abnormality, confirmed by radiology as having atelectasis or early pneumonia.  I do not feel the patient has pneumonia as he just finished Augmentin and doxycycline.  Do not feel there is a PE as there is no red flags for PE at this time.  Does  not have chest pain or increased shortness of breath.  Patient is on Eliquis  would make him at lower risk.  However he was given strict instructions to return emergency department if he feels that he is worsening.  CHF less likely as there is no fluid accumulation in the lungs on x-ray  I did explain everything to the patient.  All results discussed.  Follow-up discussed.  He was given a prescription for Sterapred and [employer].  He return emergency department if worsening.  He is in agreement treatment plan.  Discharged stable condition.      FINAL CLINICAL IMPRESSION(S) / ED DIAGNOSES   Final diagnoses:  Acute upper respiratory infection     Rx / DC Orders   ED Discharge Orders          Ordered    predniSONE  (STERAPRED UNI-PAK 21 TAB) 10 MG (21) TBPK tablet        02/09/24 1520    benzonatate (TESSALON PERLES) 100 MG capsule  3 times daily PRN        02/09/24 1520             Note:  This document was prepared using Dragon voice recognition software and may include unintentional dictation errors.    Delsie Figures, PA-C 02/09/24 1522    Twilla Galea,  MD 02/09/24 [phone]

## 2024-02-12 ENCOUNTER — Encounter (HOSPITAL_COMMUNITY): Payer: Self-pay

## 2024-02-12 ENCOUNTER — Emergency Department

## 2024-02-12 ENCOUNTER — Inpatient Hospital Stay

## 2024-02-12 ENCOUNTER — Inpatient Hospital Stay
Admission: EM | Admit: 2024-02-12 | Discharge: 2024-02-12 | DRG: 432 | Disposition: A | Discharge status: Other Acute Inpatient Hospital | Attending: Student in an Organized Health Care Education/Training Program | Admitting: Student in an Organized Health Care Education/Training Program

## 2024-02-12 ENCOUNTER — Other Ambulatory Visit: Payer: Self-pay

## 2024-02-12 DIAGNOSIS — N189 Chronic kidney disease, unspecified: Secondary | ICD-10-CM

## 2024-02-12 DIAGNOSIS — N179 Acute kidney failure, unspecified: Secondary | ICD-10-CM | POA: Diagnosis present

## 2024-02-12 DIAGNOSIS — I509 Heart failure, unspecified: Secondary | ICD-10-CM | POA: Diagnosis present

## 2024-02-12 DIAGNOSIS — N1832 Chronic kidney disease, stage 3b: Secondary | ICD-10-CM | POA: Diagnosis present

## 2024-02-12 DIAGNOSIS — I130 Hypertensive heart and chronic kidney disease with heart failure and stage 1 through stage 4 chronic kidney disease, or unspecified chronic kidney disease: Secondary | ICD-10-CM | POA: Diagnosis present

## 2024-02-12 DIAGNOSIS — E872 Acidosis, unspecified: Secondary | ICD-10-CM | POA: Diagnosis present

## 2024-02-12 DIAGNOSIS — D6851 Activated protein C resistance: Secondary | ICD-10-CM | POA: Diagnosis present

## 2024-02-12 DIAGNOSIS — Z79899 Other long term (current) drug therapy: Secondary | ICD-10-CM

## 2024-02-12 DIAGNOSIS — R042 Hemoptysis: Secondary | ICD-10-CM | POA: Diagnosis present

## 2024-02-12 DIAGNOSIS — D62 Acute posthemorrhagic anemia: Secondary | ICD-10-CM | POA: Diagnosis present

## 2024-02-12 DIAGNOSIS — Z86718 Personal history of other venous thrombosis and embolism: Secondary | ICD-10-CM

## 2024-02-12 DIAGNOSIS — Z7901 Long term (current) use of anticoagulants: Secondary | ICD-10-CM | POA: Diagnosis not present

## 2024-02-12 DIAGNOSIS — Z8701 Personal history of pneumonia (recurrent): Secondary | ICD-10-CM | POA: Diagnosis not present

## 2024-02-12 DIAGNOSIS — G928 Other toxic encephalopathy: Secondary | ICD-10-CM | POA: Diagnosis present

## 2024-02-12 DIAGNOSIS — K922 Gastrointestinal hemorrhage, unspecified: Secondary | ICD-10-CM

## 2024-02-12 DIAGNOSIS — Z888 Allergy status to other drugs, medicaments and biological substances status: Secondary | ICD-10-CM

## 2024-02-12 DIAGNOSIS — R578 Other shock: Secondary | ICD-10-CM | POA: Diagnosis present

## 2024-02-12 DIAGNOSIS — K449 Diaphragmatic hernia without obstruction or gangrene: Secondary | ICD-10-CM | POA: Diagnosis present

## 2024-02-12 DIAGNOSIS — R188 Other ascites: Secondary | ICD-10-CM | POA: Diagnosis not present

## 2024-02-12 DIAGNOSIS — K7460 Unspecified cirrhosis of liver: Principal | ICD-10-CM | POA: Diagnosis present

## 2024-02-12 DIAGNOSIS — J9601 Acute respiratory failure with hypoxia: Secondary | ICD-10-CM | POA: Diagnosis present

## 2024-02-12 DIAGNOSIS — R571 Hypovolemic shock: Secondary | ICD-10-CM

## 2024-02-12 DIAGNOSIS — Z86711 Personal history of pulmonary embolism: Secondary | ICD-10-CM | POA: Diagnosis not present

## 2024-02-12 DIAGNOSIS — K766 Portal hypertension: Secondary | ICD-10-CM | POA: Diagnosis present

## 2024-02-12 DIAGNOSIS — I820 Budd-Chiari syndrome: Secondary | ICD-10-CM | POA: Diagnosis present

## 2024-02-12 DIAGNOSIS — I8511 Secondary esophageal varices with bleeding: Secondary | ICD-10-CM | POA: Diagnosis present

## 2024-02-12 DIAGNOSIS — D869 Sarcoidosis, unspecified: Secondary | ICD-10-CM | POA: Diagnosis present

## 2024-02-12 DIAGNOSIS — K920 Hematemesis: Secondary | ICD-10-CM | POA: Diagnosis present

## 2024-02-12 LAB — URINALYSIS, W/ REFLEX TO CULTURE (INFECTION SUSPECTED)
Bilirubin Urine: NEGATIVE
Glucose, UA: NEGATIVE mg/dL
Hgb urine dipstick: NEGATIVE
Ketones, ur: NEGATIVE mg/dL
Leukocytes,Ua: NEGATIVE
Nitrite: NEGATIVE
Protein, ur: 100 mg/dL — AB
Specific Gravity, Urine: 1.019 (ref 1.005–1.030)
pH: 5 (ref 5.0–8.0)

## 2024-02-12 LAB — CBC WITH DIFFERENTIAL/PLATELET
Abs Immature Granulocytes: 0.58 10*3/uL — ABNORMAL HIGH (ref 0.00–0.07)
Basophils Absolute: 0 10*3/uL (ref 0.0–0.1)
Basophils Relative: 0 %
Eosinophils Absolute: 0.1 10*3/uL (ref 0.0–0.5)
Eosinophils Relative: 0 %
HCT: 31.5 % — ABNORMAL LOW (ref 39.0–52.0)
Hemoglobin: 9.9 g/dL — ABNORMAL LOW (ref 13.0–17.0)
Immature Granulocytes: 2 %
Lymphocytes Relative: 2 %
Lymphs Abs: 0.5 10*3/uL — ABNORMAL LOW (ref 0.7–4.0)
MCH: 28.4 pg (ref 26.0–34.0)
MCHC: 31.4 g/dL (ref 30.0–36.0)
MCV: 90.3 fL (ref 80.0–100.0)
Monocytes Absolute: 1.7 10*3/uL — ABNORMAL HIGH (ref 0.1–1.0)
Monocytes Relative: 7 %
Neutro Abs: 22 10*3/uL — ABNORMAL HIGH (ref 1.7–7.7)
Neutrophils Relative %: 89 %
Platelets: 52 10*3/uL — ABNORMAL LOW (ref 150–400)
RBC: 3.49 MIL/uL — ABNORMAL LOW (ref 4.22–5.81)
RDW: 15.4 % (ref 11.5–15.5)
Smear Review: DECREASED
WBC Morphology: INCREASED
WBC: 24.8 10*3/uL — ABNORMAL HIGH (ref 4.0–10.5)
nRBC: 0.1 % (ref 0.0–0.2)

## 2024-02-12 LAB — CBC
HCT: 25.2 % — ABNORMAL LOW (ref 39.0–52.0)
Hemoglobin: 7.3 g/dL — ABNORMAL LOW (ref 13.0–17.0)
MCH: 25 pg — ABNORMAL LOW (ref 26.0–34.0)
MCHC: 29 g/dL — ABNORMAL LOW (ref 30.0–36.0)
MCV: 86.3 fL (ref 80.0–100.0)
Platelets: 176 10*3/uL (ref 150–400)
RBC: 2.92 MIL/uL — ABNORMAL LOW (ref 4.22–5.81)
RDW: 18.4 % — ABNORMAL HIGH (ref 11.5–15.5)
WBC: 13.6 10*3/uL — ABNORMAL HIGH (ref 4.0–10.5)
nRBC: 0 % (ref 0.0–0.2)

## 2024-02-12 LAB — BLOOD GAS, ARTERIAL
Acid-base deficit: 14.8 mmol/L — ABNORMAL HIGH (ref 0.0–2.0)
Bicarbonate: 13.7 mmol/L — ABNORMAL LOW (ref 20.0–28.0)
FIO2: 40 %
MECHVT: 500 mL
Mechanical Rate: 18
O2 Saturation: 98.6 %
PEEP: 5 cmH2O
Patient temperature: 37
pCO2 arterial: 42 mmHg (ref 32–48)
pH, Arterial: 7.12 — CL (ref 7.35–7.45)
pO2, Arterial: 113 mmHg — ABNORMAL HIGH (ref 83–108)

## 2024-02-12 LAB — COMPREHENSIVE METABOLIC PANEL WITH GFR
ALT: 36 U/L (ref 0–44)
AST: 43 U/L — ABNORMAL HIGH (ref 15–41)
Albumin: 2.5 g/dL — ABNORMAL LOW (ref 3.5–5.0)
Alkaline Phosphatase: 112 U/L (ref 38–126)
Anion gap: 6 (ref 5–15)
BUN: 35 mg/dL — ABNORMAL HIGH (ref 6–20)
CO2: 19 mmol/L — ABNORMAL LOW (ref 22–32)
Calcium: 8.2 mg/dL — ABNORMAL LOW (ref 8.9–10.3)
Chloride: 108 mmol/L (ref 98–111)
Creatinine, Ser: 2.13 mg/dL — ABNORMAL HIGH (ref 0.61–1.24)
GFR, Estimated: 35 mL/min — ABNORMAL LOW (ref >60)
Glucose, Bld: 133 mg/dL — ABNORMAL HIGH (ref 70–99)
Potassium: 4.3 mmol/L (ref 3.5–5.1)
Sodium: 133 mmol/L — ABNORMAL LOW (ref 135–145)
Total Bilirubin: 2.6 mg/dL — ABNORMAL HIGH (ref 0.0–1.2)
Total Protein: 5.7 g/dL — ABNORMAL LOW (ref 6.5–8.1)

## 2024-02-12 LAB — LACTIC ACID, PLASMA
Lactic Acid, Venous: 3.8 mmol/L (ref 0.5–1.9)
Lactic Acid, Venous: 7.8 mmol/L (ref 0.5–1.9)

## 2024-02-12 LAB — MRSA NEXT GEN BY PCR, NASAL: MRSA by PCR Next Gen: NOT DETECTED

## 2024-02-12 LAB — FIBRINOGEN: Fibrinogen: 105 mg/dL — ABNORMAL LOW (ref 210–475)

## 2024-02-12 LAB — APTT: aPTT: 32 s (ref 24–36)

## 2024-02-12 LAB — PROTIME-INR
INR: 2.6 — ABNORMAL HIGH (ref 0.8–1.2)
Prothrombin Time: 27.9 s — ABNORMAL HIGH (ref 11.4–15.2)

## 2024-02-12 LAB — GLUCOSE, CAPILLARY: Glucose-Capillary: 126 mg/dL — ABNORMAL HIGH (ref 70–99)

## 2024-02-12 LAB — PREPARE RBC (CROSSMATCH)

## 2024-02-12 LAB — AMMONIA: Ammonia: 18 umol/L (ref 9–35)

## 2024-02-12 MED ORDER — PANTOPRAZOLE SODIUM 40 MG IV SOLR: 40.0000 mg | Freq: Two times a day (BID) | INTRAVENOUS | Status: DC

## 2024-02-12 MED ORDER — EPINEPHRINE HCL 5 MG/250ML IV SOLN IN NS
0.5000 ug/min | INTRAVENOUS | Status: AC
Start: 2024-02-12 — End: ?

## 2024-02-12 MED ORDER — OCTREOTIDE LOAD VIA INFUSION
50.0000 ug | Freq: Once | INTRAVENOUS | Status: AC
  Administered 2024-02-12: 50 ug via INTRAVENOUS
  Filled 2024-02-12: qty 25

## 2024-02-12 MED ORDER — ONDANSETRON HCL 4 MG/2ML IJ SOLN: 4.0000 mg | Freq: Once | INTRAMUSCULAR | Status: AC

## 2024-02-12 MED ORDER — NOREPINEPHRINE 4 MG/250ML-% IV SOLN
INTRAVENOUS | Status: AC
  Administered 2024-02-12: 10 ug/min via INTRAVENOUS
  Filled 2024-02-12: qty 250

## 2024-02-12 MED ORDER — SODIUM CHLORIDE 0.9 % IV SOLN
1.0000 g | Freq: Once | INTRAVENOUS | Status: AC
  Administered 2024-02-12: 1 g via INTRAVENOUS
  Filled 2024-02-12: qty 10

## 2024-02-12 MED ORDER — SODIUM CHLORIDE 0.9 % IV SOLN: 2.0000 g | INTRAVENOUS | Status: AC

## 2024-02-12 MED ORDER — EPINEPHRINE HCL 5 MG/250ML IV SOLN IN NS
0.5000 ug/min | INTRAVENOUS | Status: DC
  Filled 2024-02-12: qty 250

## 2024-02-12 MED ORDER — KETAMINE BOLUS VIA INFUSION
0.3500 mg/kg | Freq: Once | INTRAVENOUS | Status: AC
  Administered 2024-02-12: 23.8 mg via INTRAVENOUS
  Filled 2024-02-12: qty 25

## 2024-02-12 MED ORDER — CALCIUM GLUCONATE-NACL 1-0.675 GM/50ML-% IV SOLN
1.0000 g | Freq: Once | INTRAVENOUS | Status: AC
  Administered 2024-02-12: 1000 mg via INTRAVENOUS
  Filled 2024-02-12: qty 50

## 2024-02-12 MED ORDER — HYDROMORPHONE HCL 1 MG/ML IJ SOLN: 1.0000 mg | INTRAMUSCULAR | Status: AC | PRN

## 2024-02-12 MED ORDER — VITAMIN K1 10 MG/ML IJ SOLN
10.0000 mg | Freq: Every day | INTRAVENOUS | Status: DC
  Filled 2024-02-12: qty 1

## 2024-02-12 MED ORDER — NOREPINEPHRINE 4 MG/250ML-% IV SOLN
2.0000 ug/min | INTRAVENOUS | Status: DC
  Administered 2024-02-12: 2 ug/min via INTRAVENOUS
  Filled 2024-02-12: qty 250

## 2024-02-12 MED ORDER — NOREPINEPHRINE 4 MG/250ML-% IV SOLN: 0.0000 ug/min | INTRAVENOUS | Status: DC

## 2024-02-12 MED ORDER — SODIUM CHLORIDE 0.9% IV SOLUTION
Freq: Once | INTRAVENOUS | Status: DC
  Filled 2024-02-12: qty 250

## 2024-02-12 MED ORDER — SODIUM CHLORIDE 0.9 % IV SOLN
2.0000 g | INTRAVENOUS | Status: DC
  Filled 2024-02-12: qty 20

## 2024-02-12 MED ORDER — PANTOPRAZOLE SODIUM 40 MG IV SOLR
80.0000 mg | Freq: Once | INTRAVENOUS | Status: AC
  Administered 2024-02-12: 80 mg via INTRAVENOUS
  Filled 2024-02-12: qty 20

## 2024-02-12 MED ORDER — EMPTY CONTAINERS FLEXIBLE MISC
900.0000 mg | Freq: Once | Status: AC
  Administered 2024-02-12: 900 mg via INTRAVENOUS
  Filled 2024-02-12: qty 90

## 2024-02-12 MED ORDER — CHLORHEXIDINE GLUCONATE CLOTH 2 % EX PADS
6.0000 | MEDICATED_PAD | Freq: Every day | CUTANEOUS | Status: DC
  Administered 2024-02-12: 6 via TOPICAL

## 2024-02-12 MED ORDER — KETAMINE HCL-SODIUM CHLORIDE 1000-0.69 MG/100ML-% IV SOLN: 0.5000 mg/kg/h | INTRAVENOUS | Status: AC

## 2024-02-12 MED ORDER — ONDANSETRON HCL 4 MG/2ML IJ SOLN
INTRAMUSCULAR | Status: AC
  Administered 2024-02-12: 4 mg via INTRAVENOUS
  Filled 2024-02-12: qty 2

## 2024-02-12 MED ORDER — SODIUM CHLORIDE 0.9% IV SOLUTION: Freq: Once | INTRAVENOUS | Status: DC

## 2024-02-12 MED ORDER — CHLORHEXIDINE GLUCONATE CLOTH 2 % EX PADS: 6.0000 | MEDICATED_PAD | Freq: Every day | CUTANEOUS | Status: AC

## 2024-02-12 MED ORDER — OCTREOTIDE ACETATE 500 MCG/ML IJ SOLN: 50.0000 ug/h | INTRAMUSCULAR | Status: AC

## 2024-02-12 MED ORDER — VITAMIN K1 10 MG/ML IJ SOLN: 10.0000 mg | Freq: Every day | INTRAVENOUS | Status: AC

## 2024-02-12 MED ORDER — PANTOPRAZOLE SODIUM 40 MG IV SOLR: 40.0000 mg | Freq: Two times a day (BID) | INTRAVENOUS | Status: AC

## 2024-02-12 MED ORDER — SODIUM CHLORIDE 0.9 % IV SOLN: 250.0000 mL | INTRAVENOUS | Status: DC

## 2024-02-12 MED ORDER — KETAMINE HCL-SODIUM CHLORIDE 1000-0.69 MG/100ML-% IV SOLN
0.5000 mg/kg/h | INTRAVENOUS | Status: DC
  Administered 2024-02-12: 0.5 mg/kg/h via INTRAVENOUS
  Filled 2024-02-12: qty 100

## 2024-02-12 MED ORDER — KETAMINE HCL 50 MG/5ML IJ SOSY: 75.0000 mg | PREFILLED_SYRINGE | Freq: Once | INTRAMUSCULAR | Status: AC

## 2024-02-12 MED ORDER — NOREPINEPHRINE 4 MG/250ML-% IV SOLN: 2.0000 ug/min | INTRAVENOUS | Status: AC

## 2024-02-12 MED ORDER — ROCURONIUM BROMIDE 10 MG/ML (PF) SYRINGE
PREFILLED_SYRINGE | INTRAVENOUS | Status: AC
  Administered 2024-02-12: 100 mg via INTRAVENOUS
  Filled 2024-02-12: qty 10

## 2024-02-12 MED ORDER — SODIUM CHLORIDE 0.9 % IV SOLN
50.0000 ug/h | INTRAVENOUS | Status: DC
  Administered 2024-02-12: 50 ug/h via INTRAVENOUS
  Filled 2024-02-12 (×3): qty 1

## 2024-02-12 MED ORDER — KETAMINE HCL 50 MG/5ML IJ SOSY
PREFILLED_SYRINGE | INTRAMUSCULAR | Status: AC
  Administered 2024-02-12: 75 mg via INTRAVENOUS
  Filled 2024-02-12: qty 10

## 2024-02-12 MED ORDER — SODIUM CHLORIDE 0.9 % IV BOLUS
500.0000 mL | Freq: Once | INTRAVENOUS | Status: AC
  Administered 2024-02-12: 500 mL via INTRAVENOUS

## 2024-02-12 MED ORDER — HYDROMORPHONE HCL 1 MG/ML IJ SOLN: 1.0000 mg | INTRAMUSCULAR | Status: DC | PRN

## 2024-02-12 MED ORDER — ROCURONIUM BROMIDE 10 MG/ML (PF) SYRINGE: 100.0000 mg | PREFILLED_SYRINGE | Freq: Once | INTRAVENOUS | Status: AC

## 2024-02-12 NOTE — Code Documentation (Signed)
 X-ray at bedside

## 2024-02-12 NOTE — Discharge Summary (Signed)
 Physician Discharge Summary   Patient: Christian Fuentes MRN: [id number] DOB: 25-Sep-1966  Admit date:     02/12/2024  Discharge date: 02/12/24  Discharge Physician: Vergia Glasgow   PCP: Lonne Roan, MD   Recommendations at discharge:    Transfer to [facility] for quaternary level of care  Discharge Diagnoses: Principal Problem:   Hemorrhagic shock [facility]) Active Problems:   Secondary esophageal varices with bleeding (HCC)   Acute renal failure superimposed on chronic kidney disease (HCC)   Toxic metabolic encephalopathy  Resolved Problems:   * No resolved hospital problems. 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   Patient was in his relative usual state of health this morning when he went to work. He felt ok in the AM after breakfast but developed three episodes of vomiting bright red blood after arriving at work. He reports large bright red blood vomitus. He does not have any abdominal pain or tenderness, and has not had any signs of systemic illness. No fevers, chills, or night sweats.   Chart reviewed, and patient noted to have decompensated chronic liver disease attributed to Budd Chiari due to factor V Leiden, followed by Dr. Rolande Cleverly at [facility] hepatology. 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 He had an uneventful course following that (2019-2025) but symptoms started recurring in January of 2025 where he was admitted to our hospital with upper GI bleed secondary to varices. EGD here 10/2023 showed grade three esophageal varices (incompletely banded) and type II gastroesophageal varices. He underwent repeat  EGD 01/17/2024 again with banding of esophageal varices. He's also had large volume paracentesis one week ago.   On arrival to the ED, he was initially altered and encephalopathic. He was noted to be significantly hypotensive with nadir BP of 74/43 mmHg. Two 20G IV's were placed, and emergency release blood was administered, totaling two units of PRBC's in addition to IV fluids. He was started on an infusion of IV octreotide , and also received IV ceftriaxone  and pantoprazole . Initial blood work showed anemia to 7.3, AKI to 2.13, and mild elevation of AST and Bilirubin. Lactic acid was elevated to 3.8. Hemodynamics significantly improved with resuscitation, and both PCCM and Gastroenterology were consulted to help guide management.   During the course of his ED stay, he continued to have multiple episodes of hematemesis with a total of 1.5-2 liters of bright red blood. He was intubated emergently at the bedside by the ED physician Dr. Synetta Eves and massive transfusion protocol was initiated. He was hypotensive post intubation and briefly required vasopressor support.   Pertinent  Medical History    -Budd Chiari Syndrome, on Apixaban  -Liver Cirrhosis -Esophageal Varices -Sarcoidosis -CKD    Assessment & Plan:    Neurology #Toxic Metabolic Encephalopathy   Intubated for airway protection in the setting of massive GI bleed. Sedation with ketamine  given hypotension and active bleed.   -Maintain a RASS goal of -1 -Ketamine  to maintain RASS goal -PRN hydromorphone  for pain as necessary -Ammonia level within normal   Cardiovascular #Hemorrhagic Shock   Shock secondary to acute GI bleed from varices, with lactic acidosis on presentation. Stabilized following resuscitation  with blood and fluids, though did require vasopressors post intubation. Given elevated portal pressures secondary to cirrhosis, we will be judicious with volume resuscitation and target a goal MAP of 60.   -goal MAP 60 -hold home  beta blockers and diuretics -trend lactic acid   Pulmonary #Acute Hypoxic Respiratory Failure  Respiratory failure secondary to massive hemoptysis with inability to protect his airway. Intubated at the bedside and now mechanically ventilated. No underlying pulmonary pathology, though at high risk for aspiration given massive UGIB. Will trend ABG and correct acidosis as able.   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 Now presents with severe variceal bleed in the setting of anti-coagulation. Given hemorrhagic shock and variceal burden, he will need relief of portal pressure with TIPS vs DIPS, which might be technically difficult given complicated vasculature and previous interventions (including IVC stent, hepatic artery aneurysm, etc...). Discussed case with our IR group and given complicated vasculature and previous intervention at [facility] he might be better served there - have reached out to [facility] to arrange for transfer.   -IV octreotide  infusion -US  Liver with Doppler -PPI bid -consider Sengstaken-Blakemore -GI and IR consults placed -Reverse coagulopathy (Andexxa 900 mg, Vitamin K 10 mg qday x3)  -NPO   MELD 3.0: 30 at 02/12/2024  8:01 AM MELD-Na: 30 at 02/12/2024  8:01 AM Calculated from: Serum Creatinine: 2.13 mg/dL at 1/61/0960  4:54 AM Serum Sodium: 133 mmol/L at 02/12/2024  7:59 AM Total Bilirubin: 2.6 mg/dL at 0/98/1191  4:78 AM Serum Albumin: 2.5 g/dL at 2/95/6213  0:86 AM INR(ratio): 2.6 at 02/12/2024  8:01 AM Age at listing (hypothetical): 67 years Sex: Male at 02/12/2024  8:01 AM   Renal  #AKI on CKD #Metabolic Acidosis    In the setting of hemorrhagic shock from GI bleed. Most recent blood gas with metabolic acidosis (lactic), will optimize ventilation and perfusion and closely trend blood gas. Might benefit from a bicarbonate infusion if acidosis fails to improve.   -ABG PRN -consider bicarbonate gtt   Endocrine   ICU Glycemic protocol, monitor for hypoglycemia   Hem/Onc #Factor V Leiden #Hemorrhagic Shock   On home Apixaban  for factor V Leiden induced budd-chiari, with last dose this AM. Presents with hemorrhagic shock secondary to variceal bleed and has required massive transfusion protocol for hemodynamic support.   -Andexxa 900 mg once -vitamin K 10 mg daily x 3 -CBC q 6 hours -PRBC for goal hemoglobin of 7 -fibrinogen  > 105 > cryo ordered -transfuse if platelet count < 50   ID   IV ceftriaxone  2 grams daily for SBP prophylaxis   Consultants: GI, IR Procedures performed: Intubation, Right internal jugular Cordis, right radial arterial line    Discharge Exam: Filed Weights   02/12/24 0738  Weight: 68 kg    Condition at discharge: stable  The results of significant diagnostics from this hospitalization (including imaging, microbiology, ancillary and laboratory) are listed below for reference.   Imaging Studies: DG Chest Port 1 View Result Date: 02/12/2024 CLINICAL DATA:  Endotracheal tube placement. EXAM: PORTABLE CHEST 1 VIEW COMPARISON:  Same day. FINDINGS: Endotracheal tube is been reposition with distal tip seen 3 cm above the  carina. It is in grossly good position. Nasogastric tube is seen entering stomach. IMPRESSION: Endotracheal and nasogastric tubes in grossly good position. Electronically Signed   By: Rosalene Colon M.D.   On: 02/12/2024 13:45   DG Chest Portable 1 View Result Date: 02/12/2024 CLINICAL DATA:  Intubation EXAM: PORTABLE CHEST - 1 VIEW COMPARISON:  02/12/2024 FINDINGS: Endotracheal tube tip is at the thoracic inlet. Gastric tube extends at least as far as the  decompressed stomach, tip not seen. Low lung volumes with infrahilar bronchovascular crowding left greater than right. Heart size and mediastinal contours are within normal limits. No effusion. Embolization coils and venous stents in the visualized upper abdomen. IMPRESSION: 1. Endotracheal tube tip at the thoracic inlet. 2. Low lung volumes with infrahilar bronchovascular crowding. Electronically Signed   By: Nicoletta Barrier M.D.   On: 02/12/2024 11:53   DG Chest Port 1 View Result Date: 02/12/2024 CLINICAL DATA:  Generalized weakness.  Vomiting. EXAM: PORTABLE CHEST 1 VIEW COMPARISON:  02/09/2024 FINDINGS: Low volume film with similar asymmetric elevation right hemidiaphragm. Streaky density in the lung bases suggests atelectasis although pneumonia cannot be excluded. IVC stent device again noted. The cardiopericardial silhouette is within normal limits for size. No acute bony abnormality. IMPRESSION: Low volume film with streaky density in the lung bases, likely atelectasis. Electronically Signed   By: Donnal Fusi M.D.   On: 02/12/2024 08:17   DG Chest 2 View Result Date: 02/09/2024 CLINICAL DATA:  Acute cough EXAM: CHEST - 2 VIEW COMPARISON:  12/26/2018 FINDINGS: Similar low lung volumes with slight right hemidiaphragm elevation. Normal heart size and vascularity. Medial retrocardiac left lung base opacity may represent area of atelectasis or early left lower lobe pneumonia. Right lung remains clear. Negative for edema, effusion or pneumothorax. Trachea midline. IVC stents again noted. IMPRESSION: Low volume exam with medial left lung base atelectasis versus early pneumonia. Electronically Signed   By: Melven Stable.  Shick M.D.   On: 02/09/2024 15:12   US  Paracentesis Result Date: 02/05/2024 INDICATION: Patient with history of cirrhosis with ascites. Last paracentesis 05/17/18 with 4.8 liters out. EXAM: ULTRASOUND GUIDED DIAGNOSTIC AND THERAPEUTIC PARACENTESIS MEDICATIONS: 12 mL 1% lidocaine  COMPLICATIONS: None  immediate. PROCEDURE: Informed written consent was obtained from the patient after a discussion of the risks, benefits and alternatives to treatment. A timeout was performed prior to the initiation of the procedure. Initial ultrasound scanning demonstrates a moderate amount of ascites within the right lower abdominal quadrant. The right lower abdomen was prepped and draped in the usual sterile fashion. 1% lidocaine  was used for local anesthesia. Following this, a 19 gauge, 7-cm, Yueh catheter was introduced. An ultrasound image was saved for documentation purposes. The paracentesis was performed. The catheter was removed and a dressing was applied. The patient tolerated the procedure well without immediate post procedural complication. FINDINGS: A total of approximately 2 liters of clear, yellow fluid was removed. Samples were sent to the laboratory as requested by the clinical team. IMPRESSION: Successful ultrasound-guided paracentesis yielding 2 liters of peritoneal fluid. PLAN: If the patient eventually requires >/=2 paracenteses in a 30 day period, candidacy for formal evaluation by the [facility] will be assessed. Performed by Terressa Fess, NP Electronically Signed   By: Elene Griffes M.D.   On: 02/05/2024 12:44    Microbiology: Results for orders placed or performed during the hospital encounter of 02/05/24  Body fluid culture w Gram Stain     Status: None   Collection Time: 02/05/24 11:05  AM   Specimen: PATH Cytology Peritoneal fluid  Result Value Ref Range Status   Specimen Description   Final    FLUID Performed at [facility], [address], [place], [place] [address]    Special Requests   Final    CYTO PERI Performed at [facility], [address] [place], [place] [address]    Gram Stain NO WBC SEEN NO ORGANISMS SEEN   Final   Culture   Final    NO GROWTH 3 DAYS Performed at [facility], [address] [place], [place] [address]    Report Status 02/08/2024 FINAL  Final    Labs: CBC: Recent Labs  Lab 02/12/24 0759 02/12/24 1323  WBC 13.6* 24.8*  NEUTROABS  --  22.0*  HGB 7.3* 9.9*  HCT 25.2* 31.5*  MCV 86.3 90.3  PLT 176 52*   Basic Metabolic Panel: Recent Labs  Lab 02/12/24 0759  NA 133*  K 4.3  CL 108  CO2 19*  GLUCOSE 133*  BUN 35*  CREATININE 2.13*  CALCIUM 8.2*   Liver Function Tests: Recent Labs  Lab 02/12/24 0759  AST 43*  ALT 36  ALKPHOS 112  BILITOT 2.6*  PROT 5.7*  ALBUMIN 2.5*   CBG: Recent Labs  Lab 02/12/24 1308  GLUCAP 126*     Signed:  Vergia Glasgow, MD [facility] 02/12/2024 2:41 PM

## 2024-02-12 NOTE — ED Provider Notes (Signed)
 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  Patient reports that this morning he had 2 episodes of hematemesis.  In triage had a witnessed episode of bright red vomiting.  Patient reports history of similar, has had variceal banding twice in the past.  Reports he was recently on a steroid course for pneumonia.  Denies abdominal pain.  Last dose of Eliquis  was this morning.  I reviewed patient's discharge summary from 10/30/2023.  At that time, patient presented with coffee-ground emesis and black stool.  Hemoglobin of 7, received 1 unit PRBC, anticoagulation was held.  EGD demonstrated esophageal and gastroesophageal varices without active bleeding.  Eliquis  resumed prior to discharge.       Physical Exam   Triage Vital Signs: ED Triage Vitals  Encounter Vitals Group     BP 02/12/24 0742 128/79     Systolic BP Percentile --      Diastolic BP Percentile --      Pulse Rate 02/12/24 0739 88     Resp 02/12/24 0739 (!) 22     Temp 02/12/24 0739 (!) 97.5 F (36.4 C)     Temp Source 02/12/24 0739 Oral     SpO2 02/12/24 0739 100 %     Weight 02/12/24 0738 150 lb (68 kg)     Height 02/12/24 0738 5\' 9"  (1.753 m)     Head Circumference --      Peak Flow --      Pain Score 02/12/24 0741 0     Pain Loc --      Pain Education --      Exclude from Growth Chart --     Most recent vital signs: Vitals:   02/12/24 1530 02/12/24 1545  BP: (!) 98/55 (!) 98/55  Pulse: (!) 116 (!) 116  Resp: (!) 24 (!) 24  Temp: (!) 93.7 F (34.3 C) (!) 93.7 F (34.3 C)  SpO2: 100% 100%      General: Awake, interactive, somewhat slow to respond CV:  Regular rate, good peripheral perfusion.  Resp:  Unlabored respirations, lungs clear to auscultation Abd:  Focal abdomen with fluid wave, not tense, no appreciable tenderness Neuro:  Symmetric facial movement, fluid speech   ED Results / Procedures / Treatments   Labs (all labs ordered are listed, but only abnormal results are displayed) Labs Reviewed  COMPREHENSIVE METABOLIC PANEL WITH GFR - Abnormal; Notable for the following components:      Result Value   Sodium 133 (*)    CO2 19 (*)    Glucose, Bld 133 (*)    BUN 35 (*)    Creatinine, Ser 2.13 (*)    Calcium 8.2 (*)    Total Protein 5.7 (*)    Albumin 2.5 (*)    AST 43 (*)    Total Bilirubin 2.6 (*)    GFR, Estimated 35 (*)    All other components within normal limits  CBC - Abnormal; Notable for the following components:   WBC 13.6 (*)    RBC 2.92 (*)    Hemoglobin 7.3 (*)  HCT 25.2 (*)    MCH 25.0 (*)    MCHC 29.0 (*)    RDW 18.4 (*)    All other components within normal limits  LACTIC ACID, PLASMA - Abnormal; Notable for the following components:   Lactic Acid, Venous 3.8 (*)    All other components within normal limits  URINALYSIS, W/ REFLEX TO CULTURE (INFECTION SUSPECTED) - Abnormal; Notable for the following components:   Color, Urine YELLOW (*)    APPearance CLOUDY (*)    Protein, ur 100 (*)    Bacteria, UA MANY (*)    All other components within normal limits  PROTIME-INR - Abnormal; Notable for the following components:   Prothrombin  Time 27.9 (*)    INR 2.6 (*)    All other components within normal limits  BLOOD GAS, ARTERIAL - Abnormal; Notable for the following components:   pH, Arterial 7.12 (*)    pO2, Arterial 113 (*)    Bicarbonate 13.7 (*)    Acid-base deficit 14.8 (*)    All other components within normal limits  LACTIC ACID, PLASMA - Abnormal; Notable for the following components:   Lactic Acid, Venous 7.8 (*)    All  other components within normal limits  CBC WITH DIFFERENTIAL/PLATELET - Abnormal; Notable for the following components:   WBC 24.8 (*)    RBC 3.49 (*)    Hemoglobin 9.9 (*)    HCT 31.5 (*)    Platelets 52 (*)    Neutro Abs 22.0 (*)    Lymphs Abs 0.5 (*)    Monocytes Absolute 1.7 (*)    Abs Immature Granulocytes 0.58 (*)    All other components within normal limits  FIBRINOGEN  - Abnormal; Notable for the following components:   Fibrinogen  105 (*)    All other components within normal limits  GLUCOSE, CAPILLARY - Abnormal; Notable for the following components:   Glucose-Capillary 126 (*)    All other components within normal limits  MRSA NEXT GEN BY PCR, NASAL  AMMONIA  APTT  LACTIC ACID, PLASMA  CBC  CBC  CBC  TYPE AND SCREEN  PREPARE RBC (CROSSMATCH)  MASSIVE TRANSFUSION PROTOCOL ORDER (BLOOD BANK NOTIFICATION)  PREPARE FRESH FROZEN PLASMA  PREPARE PLATELET PHERESIS  PREPARE CRYOPRECIPITATE  PREPARE RBC (CROSSMATCH)     EKG EKG independently reviewed interpreted by myself (ER attending) demonstrates:    RADIOLOGY Imaging independently reviewed and interpreted by myself demonstrates:  CXR without focal consolidation Post intubation xr with high positioning of ETT, advanced 3 cm    PROCEDURES:  Critical Care performed: Yes, see critical care procedure note(s)  CRITICAL CARE Performed by: Claria Crofts   Total critical care time: 78 minutes  Critical care time was exclusive of separately billable procedures and treating other patients.  Critical care was necessary to treat or prevent imminent or life-threatening deterioration.  Critical care was time spent personally by me on the following activities: development of treatment plan with patient and/or surrogate as well as nursing, discussions with consultants, evaluation of patient's response to treatment, examination of patient, obtaining history from patient or surrogate, ordering and performing treatments and  interventions, ordering and review of laboratory studies, ordering and review of radiographic studies, pulse oximetry and re-evaluation of patient's condition.   Procedure Name: Intubation Date/Time: 02/12/2024 3:58 PM  Performed by: Claria Crofts, MDInduction Type: Rapid sequence Laryngoscope Size: Glidescope and 3 Grade View: Grade II Tube size: 7.5 mm Number of attempts: 1 Secured at: 25 (later advanced 3 cm) cm Tube  secured with: ETT holder Dental Injury: Teeth and Oropharynx as per pre-operative assessment        MEDICATIONS ORDERED IN ED: Medications  octreotide  (SANDOSTATIN ) 2 mcg/mL load via infusion 50 mcg (50 mcg Intravenous Bolus from Bag 02/12/24 0858)    And  octreotide  (SANDOSTATIN ) 500 mcg in sodium chloride  0.9 % 250 mL (2 mcg/mL) infusion (50 mcg/hr Intravenous Restarted 02/12/24 1105)  0.9 %  sodium chloride  infusion (Manually program via Guardrails IV Fluids) ( Intravenous Not Given 02/12/24 1320)  0.9 %  sodium chloride  infusion (Manually program via Guardrails IV Fluids) ( Intravenous Not Given 02/12/24 1320)  ketamine  (KETALAR ) adult IV infusion 1000mg /130mL (10 mg/mL-Premix) (0.5 mg/kg/hr  68 kg Intravenous New Bag/Given 02/12/24 1212)  pantoprazole  (PROTONIX ) injection 40 mg (0 mg Intravenous Hold 02/12/24 1335)  HYDROmorphone  (DILAUDID ) injection 1 mg (has no administration in time range)  phytonadione  (VITAMIN K) 10 mg in dextrose  5 % 50 mL IVPB (has no administration in time range)  cefTRIAXone  (ROCEPHIN ) 2 g in sodium chloride  0.9 % 100 mL IVPB (has no administration in time range)  Chlorhexidine  Gluconate Cloth 2 % PADS 6 each (6 each Topical Given 02/12/24 1320)  0.9 %  sodium chloride  infusion (0 mLs Intravenous Hold 02/12/24 1334)  norepinephrine  (LEVOPHED ) 4mg  in (0.016 mg/mL) premix infusion (2 mcg/min Intravenous New Bag/Given 02/12/24 1334)  0.9 %  sodium chloride  infusion (Manually program via Guardrails IV Fluids) (has no administration in time range)   EPINEPHrine  (ADRENALIN ) 5 mg in NS 250 mL (0.02 mg/mL) premix infusion (has no administration in time range)  pantoprazole  (PROTONIX ) injection 80 mg (80 mg Intravenous Given 02/12/24 0818)  cefTRIAXone  (ROCEPHIN ) 1 g in sodium chloride  0.9 % 100 mL IVPB (0 g Intravenous Stopped 02/12/24 0858)  sodium chloride  0.9 % bolus 500 mL (0 mLs Intravenous Stopped 02/12/24 0909)  ondansetron  (ZOFRAN ) injection 4 mg (4 mg Intravenous Given 02/12/24 1029)  ketamine  50 mg in normal saline 5 mL (10 mg/mL) syringe (75 mg Intravenous Given 02/12/24 1033)  rocuronium (ZEMURON) injection 100 mg (100 mg Intravenous Given 02/12/24 1033)  calcium gluconate 1 g/ 50 mL sodium chloride  IVPB (0 mg Intravenous Stopped 02/12/24 1147)  ketamine  (KETALAR ) bolus via infusion 23.8 mg (23.8 mg Intravenous Bolus from Bag 02/12/24 1215)  coag fact Xa recombinant (ANDEXXA) low dose infusion 900 mg (900 mg Intravenous New Bag/Given 02/12/24 1203)     IMPRESSION / MDM / ASSESSMENT AND PLAN / ED COURSE  I reviewed the triage vital signs and the nursing notes.  Differential diagnosis includes, but is not limited to, acute upper GI bleed secondary to varices, irritation of gastric lining, other GI bleeding source  Patient's presentation is most consistent with acute presentation with potential threat to life or bodily function.  58 year old male presenting to the emergency department with hematemesis, history of similar.  Fortunately stable vitals at the moment.  2 points of IV access established.  Labs sent.  Empirically treating for variceal bleed given history with octreotide , pantoprazole , Rocephin  prophylaxis.  Clinical Course as of 02/12/24 1556  Tue Feb 12, 2024  0901 Patient unfortunately with downtrending blood pressures down to systolics of the 70s, MAP of 60.  Ordered for small fluid bolus as well as emergency release blood.  Will consult GI.  [NR]  0907 Case discussed with Dr. Ole Berkeley.  Recommends stabilization prior to any  intervention. Will watch BP with blood transfusion, but likely will discuss with ICU team.  [NR]  0925 BP fortunately improving with  fluid and blood resuscitation, but with clinical history will discuss with ICU team. [NR]  [phone] Case reviewed with Dr. Darnelle Elders who will evaluate the patient for anticipated admission.  [NR]  1006 Notified by RN that patient had additional episode of hematemesis, about 400-500cc. Patient is still awake, protecting his airway.  He does confirm that he is a full code and would want intubation should he decompensate. ICU team updated.  [NR]  1139 Shortly after prior episode, patient continued to have additional hematemesis as well as onset of large-volume melena.  He initially remained hemodynamically stable, but given clinical course I did discuss intubation to secure the airway with the patient.  He was comfortable proceeding with this.  Unfortunately, as intubation meds were being pushed, patient did become hypotensive.  He was able to be intubated, but did have significant postintubation hypotension with systolics in the 40s.  He initially was placed on Levophed  given consideration for physiologic response to intubation, but remained profoundly hypotensive despite titrating levo to 40.  He was also noted to have significant blood return in his OG tube, likely multifactorial shock.  A massive transfusion protocol was activated and patient received 3 additional units of PRBCs as well as 2 units of FFP and 1g of calcium.  Following this, he fortunately did have improvement in his hemodynamics.  ICU team was updated.  They were coordinating with GI and IR to see if patient can be admitted to our facility.  After discussion, current plan will be admission to our ICU.  They have ordered for reversal of his Eliquis .  Should he have recurrent hematemesis, consideration for placement of [place] tube. [NR]  1147 DG Chest Portable 1 View High position of ETT on my review, will advance 3  cm.  [NR]    Clinical Course User Index [NR] Claria Crofts, MD   Patient admitted to ICU for further management.   FINAL CLINICAL IMPRESSION(S) / ED DIAGNOSES   Final diagnoses:  Liver disease, chronic, with cirrhosis (HCC)     Rx / DC Orders   ED Discharge Orders     None        Note:  This document was prepared using Dragon voice recognition software and may include unintentional dictation errors.   Claria Crofts, MD 02/12/24 1600

## 2024-02-12 NOTE — ED Notes (Addendum)
 Pt alert, NAD, calm, skin W&D, VSS, takes BB and eliquis , interactive with bloody emesis, BP low, EDP at [facility], emergency release blood ordered, pending arrival of blood. BP improved with positioning and IVF. Verbal consent, agreeable to blood transfusion.

## 2024-02-12 NOTE — Progress Notes (Signed)
 Chief Complaint: GI Bleed. Request is for IR evaluation.   Referring Physician(s): Dr. Vergia Glasgow  Supervising Physician: Elene Griffes  Patient Status: [facility] - In-pt  History of Present Illness: Christian Fuentes is a 58 y.o. male Inpatient. 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 Christian Fuentes presented to ED at [facility] in with hematesmesis and melena. EGD was performed on 1.9.25 with banding of 5 esophageal varices and noted to have GOV2 varices. In March 2025 the patient developed abdominal distension. IR performed a paracentesis with 2 liters of clear yellow fluid removed. Surveillance EGD  was scheduled for 4.10.25  Christian Fuentes presented to the ED at [facility] on 4.29.25 with generalized weakness and hematemesis. Found to be hypotensive requiring intubation and blood/ fluid transfusion.  IR was consulted for possible TIPS procedure. No recent imaging. Patient currently intubated on the ventilatory. Hypotensive with a MAP of 65. HR 101.  Medically manage reverse anti EGD.  Reach to [facility] possible anatomy is not amenable to TIPS procedure. Should heroic measures be warranted a direct intrahepatic portocaval shunt could be considered.   Past Medical History:  Diagnosis Date   Cirrhosis (HCC)    Factor 5 Leiden mutation, heterozygous (HCC)    Pulmonary embolism (HCC)    Sarcoidosis     Past Surgical History:  Procedure Laterality Date   CAROTID STENT     ESOPHAGEAL BANDING  10/25/2023   Procedure: ESOPHAGEAL BANDING;  Surgeon: Marnee Sink, MD;  Location: [facility] ENDOSCOPY;  Service: Endoscopy;;   ESOPHAGOGASTRODUODENOSCOPY (EGD) WITH PROPOFOL  N/A 10/25/2023   Procedure: ESOPHAGOGASTRODUODENOSCOPY (EGD) WITH PROPOFOL ;  Surgeon: Marnee Sink, MD;   Location: [facility] ENDOSCOPY;  Service: Endoscopy;  Laterality: N/A;    Allergies: Diphenhydramine hcl  Medications: Prior to Admission medications   Medication Sig Start Date End Date Taking? Authorizing Provider  apixaban  (ELIQUIS ) 5 MG TABS tablet Take 5 mg by mouth 2 (two) times daily. 03/21/16  Yes [provider]  benzonatate (TESSALON PERLES) 100 MG capsule Take 1 capsule (100 mg total) by mouth 3 (three) times daily as needed for cough. 02/09/24 02/08/25 Yes Fisher, Rufino Coulter, PA-C  carvedilol (COREG) 3.125 MG tablet Take 3.125 mg by mouth 2 (two) times daily with a meal. 12/31/23 12/30/24 Yes [provider]  furosemide  (LASIX ) 20 MG tablet Take 1 tablet by mouth daily. 12/31/23  Yes [provider]  pantoprazole  (PROTONIX ) 40 MG tablet Take 1 tablet (40 mg total) by mouth daily. 10/30/23  Yes Donaciano Frizzle, MD  predniSONE  (STERAPRED UNI-PAK 21 TAB) 10 MG (21) TBPK tablet Take 6 pills on day one then decrease by 1 pill each day 02/09/24  Yes Fisher, Rufino Coulter, PA-C  spironolactone  (ALDACTONE ) 50 MG tablet Take 50 mg by mouth daily.   Yes [provider]  ferrous sulfate  325 (65 FE) MG EC tablet Take 1 tablet (325 mg total) by mouth daily with breakfast. 10/30/23 12/29/23  Donaciano Frizzle, MD     History reviewed. No pertinent family history.  Social History   Socioeconomic History   Marital status: Divorced    Spouse name: Not on file   Number of children: Not on file   Years of education: Not on file   Highest education level: Not on file  Occupational History   Not on file  Tobacco Use   Smoking status: Never   Smokeless tobacco: Never  Vaping Use   Vaping status: Never Used  Substance and Sexual Activity   Alcohol use: No   Drug use: No   Sexual activity: Not on file  Other Topics Concern   Not on file  Social History Narrative   Lives alone, works   Social Drivers of Corporate investment banker Strain: Low Risk  (11/27/2022)   Received from [facility]   Overall Financial Resource Strain (CARDIA)    Difficulty of Paying Living Expenses: Not hard at all  Food Insecurity: No Food Insecurity (10/25/2023)   Hunger Vital Sign    Worried About Running Out of Food in the Last Year: Never true    Ran Out of Food in the Last Year: Never true  Transportation Needs: No Transportation Needs (10/25/2023)   PRAPARE - Administrator, Civil Service (Medical): No    Lack of Transportation (Non-Medical): No  Physical Activity: Inactive (01/10/2018)   Exercise Vital Sign    Days of Exercise per Week: 0 days    Minutes of Exercise per Session: 0 min  Stress: No Stress Concern Present (01/10/2018)   [employer] of Occupational Health - Occupational Stress Questionnaire    Feeling of Stress : Not at all  Social Connections: Moderately Integrated (10/25/2023)   Social Connection and Isolation Panel [NHANES]    Frequency of Communication with Friends and Family: Twice a week    Frequency of Social Gatherings with Friends and Family: Twice a week    Attends Religious Services: 1 to 4 times per year    Active Member of [employer] or Organizations: No    Attends Engineer, structural: 1 to 4 times per year    Marital Status: Divorced     Review of Systems: A 12 point ROS discussed and pertinent positives are indicated in the HPI above.  All other systems are negative.  Review of Systems  Unable to perform ROS: Acuity of condition    Vital Signs: BP (!) 84/65   Pulse 98   Temp (!) 94.3 F (34.6 C)   Resp 18   Ht 5\' 9"  (1.753 m)   Wt 150 lb (68 kg)   SpO2 97%   BMI 22.15 kg/m     Physical Exam Constitutional:      Appearance: He is ill-appearing.  Cardiovascular:     Rate and Rhythm: Regular rhythm. Tachycardia present.  Pulmonary:     Comments: Intubated on the ventilator.   Abdominal:     General: There is distension.     Comments: OG tube in place with dark red output  Genitourinary:     Comments: Foley catheter present with minimal amount of clear yellow urine present Skin:    General: Skin is dry.     Imaging: DG Chest Port 1 View Result Date: 02/12/2024 CLINICAL DATA:  Endotracheal tube placement. EXAM: PORTABLE CHEST 1 VIEW COMPARISON:  Same day. FINDINGS: Endotracheal tube is been reposition with distal tip seen 3 cm above the carina. It is in grossly good position. Nasogastric tube is seen entering stomach. IMPRESSION: Endotracheal and nasogastric tubes in grossly good position. Electronically Signed   By: Rosalene Colon M.D.   On: 02/12/2024 13:45   DG Chest Portable 1 View Result Date: 02/12/2024 CLINICAL DATA:  Intubation EXAM: PORTABLE CHEST - 1 VIEW COMPARISON:  02/12/2024 FINDINGS: Endotracheal tube tip is at the  thoracic inlet. Gastric tube extends at least as far as the decompressed stomach, tip not seen. Low lung volumes with infrahilar bronchovascular crowding left greater than right. Heart size and mediastinal contours are within normal limits. No effusion. Embolization coils and venous stents in the visualized upper abdomen. IMPRESSION: 1. Endotracheal tube tip at the thoracic inlet. 2. Low lung volumes with infrahilar bronchovascular crowding. Electronically Signed   By: Nicoletta Barrier M.D.   On: 02/12/2024 11:53   DG Chest Port 1 View Result Date: 02/12/2024 CLINICAL DATA:  Generalized weakness.  Vomiting. EXAM: PORTABLE CHEST 1 VIEW COMPARISON:  02/09/2024 FINDINGS: Low volume film with similar asymmetric elevation right hemidiaphragm. Streaky density in the lung bases suggests atelectasis although pneumonia cannot be excluded. IVC stent device again noted. The cardiopericardial silhouette is within normal limits for size. No acute bony abnormality. IMPRESSION: Low volume film with streaky density in the lung bases, likely atelectasis. Electronically Signed   By: Donnal Fusi M.D.   On: 02/12/2024 08:17   DG Chest 2 View Result Date: 02/09/2024 CLINICAL DATA:   Acute cough EXAM: CHEST - 2 VIEW COMPARISON:  12/26/2018 FINDINGS: Similar low lung volumes with slight right hemidiaphragm elevation. Normal heart size and vascularity. Medial retrocardiac left lung base opacity may represent area of atelectasis or early left lower lobe pneumonia. Right lung remains clear. Negative for edema, effusion or pneumothorax. Trachea midline. IVC stents again noted. IMPRESSION: Low volume exam with medial left lung base atelectasis versus early pneumonia. Electronically Signed   By: Melven Stable.  Shick M.D.   On: 02/09/2024 15:12   US  Paracentesis Result Date: 02/05/2024 INDICATION: Patient with history of cirrhosis with ascites. Last paracentesis 05/17/18 with 4.8 liters out. EXAM: ULTRASOUND GUIDED DIAGNOSTIC AND THERAPEUTIC PARACENTESIS MEDICATIONS: 12 mL 1% lidocaine  COMPLICATIONS: None immediate. PROCEDURE: Informed written consent was obtained from the patient after a discussion of the risks, benefits and alternatives to treatment. A timeout was performed prior to the initiation of the procedure. Initial ultrasound scanning demonstrates a moderate amount of ascites within the right lower abdominal quadrant. The right lower abdomen was prepped and draped in the usual sterile fashion. 1% lidocaine  was used for local anesthesia. Following this, a 19 gauge, 7-cm, Yueh catheter was introduced. An ultrasound image was saved for documentation purposes. The paracentesis was performed. The catheter was removed and a dressing was applied. The patient tolerated the procedure well without immediate post procedural complication. FINDINGS: A total of approximately 2 liters of clear, yellow fluid was removed. Samples were sent to the laboratory as requested by the clinical team. IMPRESSION: Successful ultrasound-guided paracentesis yielding 2 liters of peritoneal fluid. PLAN: If the patient eventually requires >/=2 paracenteses in a 30 day period, candidacy for formal evaluation by the [facility]  Interventional Radiology [facility] will be assessed. Performed by Terressa Fess, NP Electronically Signed   By: Elene Griffes M.D.   On: 02/05/2024 12:44    Labs:  CBC: Recent Labs    10/25/23 0449 10/25/23 1653 10/26/23 0453 10/26/23 1720 10/29/23 2112 10/30/23 0521 12/26/23 0520 02/12/24 0759  WBC 7.7  --  7.6  --   --   --  3.8* 13.6*  HGB 8.0*   < > 7.6*   < > 7.8* 8.0* 7.9* 7.3*  HCT 24.2*  --  22.8*  --   --   --  26.1* 25.2*  PLT 100*  --  111*  --   --   --  107* 176   < > =  values in this interval not displayed.    COAGS: Recent Labs    10/24/23 0853 10/29/23 0907 12/26/23 0520 02/12/24 0801  INR 1.4* 1.3* 1.1 2.6*  APTT 27 34 29 32    BMP: Recent Labs    10/28/23 0532 10/29/23 0448 12/26/23 0520 02/12/24 0759  NA 136 137 138 133*  K 3.7 4.0 3.9 4.3  CL 107 106 111 108  CO2 21* 22 23 19*  GLUCOSE 99 95 110* 133*  BUN 25* 27* 26* 35*  CALCIUM 8.0* 7.9* 8.6* 8.2*  CREATININE 1.37* 1.54* 1.50* 2.13*  GFRNONAA 60* 52* 53* 35*    LIVER FUNCTION TESTS: Recent Labs    10/24/23 0853 10/25/23 0449 12/26/23 0520 02/12/24 0759  BILITOT 1.1 0.5 1.7* 2.6*  AST 22 22 31  43*  ALT 23 22 23  36  ALKPHOS 92 68 125 112  PROT 6.3* 5.0* 6.7 5.7*  ALBUMIN 3.3* 2.6* 3.2* 2.5*      Assessment and Plan:  58 y.o. male. Inpatient. 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 20 mm X 50 mm Gianturco stent (Cook-Z) stent placed on 6.8.2013 and wallstents placed on 6.9.2013. BATO in 2019 at [facility] complicated by hepatic artery aneurysm with bleeding requiring emergent coil embolization. Christian Fuentes presented to ED at [facility] in with hematesmesis and melena. EGD was performed on 1.9.25 with banding of 5 esophageal varices and noted to have GOV2 varices. In March  2025 the patient developed abdominal distension. IR performed a paracentesis with 2 liters of clear yellow fluid removed. Most recent surveillance EGD performed at [facility] on 4.3.25. Findings read Four columns of grade II varices with no stigmata of recent bleeding were found in the lower third of the esophagus. No red wale signs were present. Stigmata of prior treatment were evident. Two bands were successfully placed with incomplete eradication of varices. There was no bleeding during the procedure. A small hiatal hernia was present. The entire examined stomach was normal. There was no clear evidence of gastric varices.   Christian Fuentes presented to the ED at [facility] on 4.29.25 with generalized weakness and hematemesis. Found to be hypotensive requiring intubation and 3 units of PRBC and 2 units of FFP transfused.  IR was consulted for possible TIPS procedure. No recent imaging. Patient currently intubated on the ventilatory. Hypotensive with a MAP of 65. HR 101.  RECOMMENDATIONS - Continue to medically manage.  - Recommend reverse anticoagulation - Consider EGD for further evaluation of esophageal and/or gastric varices - Contact  [facility]  for possible transfer   Anatomy is not amenable to TIPS procedure. Should heroic measures be warranted a direct intrahepatic portocaval shunt could be consider.  Thank you for this interesting consult.  I greatly enjoyed meeting Christian Fuentes and look forward to participating in their care.  A copy of this report was sent to the requesting provider on this date.  Electronically Signed: Marceil Sensor, NP 02/12/2024, 1:56 PM   I spent a total of 20 Minutes    in face to face in clinical consultation, greater than 50% of which was counseling/coordinating care for evaluation for GI bleed.

## 2024-02-12 NOTE — Code Documentation (Signed)
 Float nurse sent for emergency release blood at this time per VO of Ray, MD

## 2024-02-12 NOTE — ED Notes (Signed)
 Called Pharmacy to mix Andexxa at this time

## 2024-02-12 NOTE — Procedures (Addendum)
 Brief Note:  Patient's hemodynamics deteriorated with significantly increased bloody output from the OG tube consistent with recurrent esophageal hemorrhage and MAP in the 30's. Massive transfusion protocol initiated, and vasopressor support increased. He also received multiple amps of bicarbonate, calcium gluconate, PRBC's, FFP's, and cryoprecipitate. Blakemore tube inserted.  Vergia Glasgow, MD [facility] 02/12/2024 3:43 PM

## 2024-02-12 NOTE — Progress Notes (Signed)
 1315 - Pt to ICU and placed on monitor. VSS. Bair hugger placed on pt for hypothermia. Lung sounds diminished bilaterally. Abdomen slightly distended with faint bowel sounds present. OG connected to LIS per verbal orders by Dr Darnelle Elders at bedside.  1330- Pt now hypotensive - Levophed  started. Dr Darnelle Elders and Ovidio Blower, NP notified of change in pt condition.  1349- Time out for emergent placement of arterial line by Ovidio Blower, NP.   1420- 500 NS bolus given per verbal orders by Dr Darnelle Elders. Pt to be transferred to [facility] per Dr Darnelle Elders at bedside. Plan for one unit of blood to be given prior to transfer.   1421- Time out for emergent placement of central line in right internal jugular by Dr Darnelle Elders.  1425- Report given to [facility] with [employer], and Multimedia programmer at [employer].   1445- Pt having excessive amount of blood from around ETT - oral suction being utilized. Pt now extremely hypotensive, Dr Darnelle Elders and Ovidio Blower, NP at bedside. Verbal orders for one liter NS to be given, and to increase Levophed .   1453- Verbal orders by Dr Darnelle Elders to continue MTP. Blood bank notified. Code cart being utilized for resuscitation measures. Total of 4 sodium bicarbonate , 1 calcium, and 1 epinephrine  given - with epinephrine  drip started after. Total blood products received - 8 units of PRBCs, 1 cryo, and 1 FFP. Total blood loss from oral suction - , and blood loss from OGT.   1525- Emergent placement of Blakemore tube by Dr Darnelle Elders complete. [place] air at bedside for updated report.   1545- Pt taken off of ICU monitor for transfer to [facility]. VSS. Attempt made by Dr Darnelle Elders to pt's daughter for update on pt condition and location.  59 - Pt's daughter, Christian Fuentes, given update on pt condition and location. Pt's daughter made aware of pt belongings left at bedside, and where to pick up the belongings. All questions answered at this time.

## 2024-02-12 NOTE — Procedures (Signed)
 Central Venous Catheter Insertion Procedure Note  CATRELL THORNBERRY  [id number]  October 07, 1966  Date:02/12/24  Time:2:33 PM   Provider Performing:Jestin Burbach   Procedure: Insertion of Non-tunneled Central Venous Catheter(36556) with US  guidance (56387)   Indication(s) Medication administration  Consent Unable to obtain consent due to emergent nature of procedure.  Anesthesia Topical only with 1% lidocaine    Timeout Verified patient identification, verified procedure, site/side was marked, verified correct patient position, special equipment/implants available, medications/allergies/relevant history reviewed, required imaging and test results available.  Sterile Technique Maximal sterile technique including full sterile barrier drape, hand hygiene, sterile gown, sterile gloves, mask, hair covering, sterile ultrasound probe cover (if used).  Procedure Description Area of catheter insertion was cleaned with chlorhexidine  and draped in sterile fashion.  With real-time ultrasound guidance a introducer sheath was placed into the right internal jugular vein. Nonpulsatile blood flow and easy flushing noted in all ports.  The catheter was sutured in place and sterile dressing applied.  Complications/Tolerance None; patient tolerated the procedure well. Chest X-ray is ordered to verify placement for internal jugular or subclavian cannulation.   Chest x-ray is not ordered for femoral cannulation.  EBL Minimal  Specimen(s) None  Vergia Glasgow, MD [facility] 02/12/2024 2:33 PM

## 2024-02-12 NOTE — ED Triage Notes (Addendum)
 Pt to ED via POV for generalized weakness since this morning. Pt barely answering questions in triage, hard to get CC or history from pt. When asked, pt endorses vomiting blood since 2 hours ago. Pt appears altered. Pt then began vomiting blood in triage. Calling first RN for room.

## 2024-02-12 NOTE — Procedures (Signed)
 Christian Fuentes InsertionXZAVEON WEESE  [id number]  1966-08-13  Date:02/12/24  Time:3:34 PM   Provider Performing:Adaiah Morken   Procedure: Insertion of Blakemore tube  Indication(s) Hemorrhagic Shock  Consent Unable to obtain consent due to emergent nature of procedure.   Timeout: Emergent   Procedure Description  Christian Fuentes tube was prepped and set up prior to insertion. Gastric and esophageal balloons were tested and ensured to not have a leak. The Christian Fuentes was inserted through the mouth all the way to the level of the ports. CXR performed and blakemore confirmed in the stomach. 50 cc of air was inserted into the gastric balloon port. The tube was retracted and a CXR was taken to confirm the balloon was in the stomach. We then retracted the balloon to tension and connected to a 1 L bag of saline. CXR was performed to confirm placement in the correct position. Patient's hemodynamics stabilized after insertion of the blakemore.    Christian Glasgow, MD [facility] 02/12/2024 3:38 PM

## 2024-02-12 NOTE — Code Documentation (Signed)
 ET tube placed. Color change noted. Xray called.

## 2024-02-12 NOTE — ED Notes (Signed)
 Pt with approx 500 ml bloody emesis. Dr Synetta Eves notified.

## 2024-02-12 NOTE — Code Documentation (Addendum)
 OG tube placed at this time. Auscultated by MD until xray placement is verified.

## 2024-02-12 NOTE — Procedures (Signed)
 Arterial Catheter Insertion Procedure Note  TERRIUS KIEBLER  [id number]  1965-10-30  Date:02/12/24  Time:2:05 PM    Provider Performing: Delanna Fears    Procedure: Insertion of Arterial Line (13086) with US  guidance (57846)   Indication(s) Blood pressure monitoring and/or need for frequent ABGs  Consent Unable to obtain consent due to emergent nature of procedure.  Anesthesia None   Time Out Verified patient identification, verified procedure, site/side was marked, verified correct patient position, special equipment/implants available, medications/allergies/relevant history reviewed, required imaging and test results available.   Sterile Technique Maximal sterile technique including full sterile barrier drape, hand hygiene, sterile gown, sterile gloves, mask, hair covering, sterile ultrasound probe cover (if used).   Procedure Description Area of catheter insertion was cleaned with chlorhexidine  and draped in sterile fashion. With real-time ultrasound guidance an arterial catheter was placed into the right radial artery.  Appropriate arterial tracings confirmed on monitor.     Complications/Tolerance None; patient tolerated the procedure well.   EBL Minimal   Specimen(s) None    Cherylann Corpus, AGACNP-BC [facility] Prefer epic messenger for cross cover needs If after hours, please call E-link

## 2024-02-12 NOTE — Consult Note (Signed)
 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  Patient was in his relative usual state of health this morning when he went to work. He felt ok in the AM after breakfast but developed three episodes of vomiting bright red blood after arriving at work. He reports large bright red blood vomitus. He does not have any abdominal pain or tenderness, and has not had any signs of systemic illness. No fevers, chills, or night sweats.  Chart reviewed, and patient noted to have decompensated chronic liver disease attributed to Budd Chiari due to factor V Leiden, followed by Dr. Rolande Cleverly at [facility] hepatology. 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 He had an uneventful course following that (2019-2025) but symptoms started recurring in January of 2025 where he was admitted to our hospital with upper GI bleed secondary to varices. EGD here 10/2023 showed grade three esophageal varices (incompletely banded) and type II gastroesophageal varices. He underwent repeat EGD 01/17/2024 again with banding of esophageal varices. He's also had large volume paracentesis one week ago.  On arrival to the ED, he was initially altered and encephalopathic. He was noted to be significantly hypotensive with nadir BP of 74/43 mmHg. Two 20G IV's were placed, and emergency release blood was administered, totaling two units of PRBC's in addition to IV  fluids. He was started on an infusion of IV octreotide , and also received IV ceftriaxone  and pantoprazole . Initial blood work showed anemia to 7.3, AKI to 2.13, and mild elevation of AST and Bilirubin. Lactic acid was elevated to 3.8. Hemodynamics significantly improved with resuscitation, and both PCCM and Gastroenterology were consulted to help guide management.  During the course of his ED stay, he continued to have multiple episodes of hematemesis with a total of 1.5-2 liters of bright red blood. He was intubated emergently at the bedside by the ED physician Dr. Synetta Eves and massive transfusion protocol was initiated. He was hypotensive post intubation and briefly required vasopressor support.  Pertinent  Medical History   -Budd Chiari Syndrome, on Apixaban  -Liver Cirrhosis -Esophageal Varices -Sarcoidosis -CKD  Significant Hospital Events: Including procedures, antibiotic start and stop dates in addition to other pertinent events   02/12/2024: presentation with variceal bleed, intubated, massive transfusion protocol   Objective   Blood pressure 131/73, pulse 77, temperature (!) 97.5 F (36.4 C), temperature source Oral, resp. rate 17, height 5\' 9"  (1.753 m), weight 68 kg, SpO2 100%.        Intake/Output Summary (Last 24 hours) at 02/12/2024 0947 Last data filed at 02/12/2024 4098 Gross per 24 hour  Intake 1230 ml  Output --  Net 1230 ml   Filed Weights   02/12/24 0738  Weight: 68 kg    Examination: Physical Exam Constitutional:      General: He is not in acute distress.    Appearance: He is ill-appearing.  Cardiovascular:     Rate and Rhythm: Normal rate and  regular rhythm.     Pulses: Normal pulses.     Heart sounds: Normal heart sounds.  Pulmonary:     Effort: Pulmonary effort is normal.     Breath sounds: Normal breath sounds.  Abdominal:     General: There is distension.     Palpations: Abdomen is soft.     Tenderness: There is no abdominal tenderness. There is no  guarding.  Musculoskeletal:     Right lower leg: No edema.     Left lower leg: No edema.  Neurological:     Comments: Alert and oriented on presentation, unresponsive following intubation      Assessment & Plan:   Neurology #Toxic Metabolic Encephalopathy  Intubated for airway protection in the setting of massive GI bleed. Sedation with ketamine  given hypotension and active bleed.  -Maintain a RASS goal of -1 -Ketamine  to maintain RASS goal -PRN hydromorphone  for pain as necessary -Ammonia level within normal  Cardiovascular #Hemorrhagic Shock  Shock secondary to acute GI bleed from varices, with lactic acidosis on presentation. Stabilized following resuscitation with blood and fluids, though did require vasopressors post intubation. Given elevated portal pressures secondary to cirrhosis, we will be judicious with volume resuscitation and target a goal MAP of 60.  -goal MAP 60 -hold home beta blockers and diuretics -trend lactic acid  Pulmonary #Acute Hypoxic Respiratory Failure  Respiratory failure secondary to massive hemoptysis with inability to protect his airway. Intubated at the bedside and now mechanically ventilated. No underlying pulmonary pathology, though at high risk for aspiration given massive UGIB. Will trend ABG and correct acidosis as able.  -lung protective strategies -Wean FiO2 & PEEP as tolerated to maintain O2 sats >92% -VAP Bundle -ABG PRN -repeat CXR  Gastrointestinal #Upper GI Bleed #Liver Cirrhosis #Variceal Bleed  History of severe liver cirrhosis secondary to Budd Chiari, s/p thrombectomy and IVC stent in 2013. 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 Now presents with severe variceal bleed in the setting of anti-coagulation. Given hemorrhagic shock and variceal burden, he will need relief of portal pressure with TIPS vs DIPS, which might be technically difficult given complicated  vasculature and previous interventions (including IVC stent, hepatic artery aneurysm, etc...). Discussed case with our IR group and given complicated vasculature and previous intervention at [facility] he might be better served there - have reached out to [facility] to arrange for transfer.  -IV octreotide  infusion -US  Liver with Doppler -PPI bid -consider Sengstaken-Blakemore -GI and IR consults placed -Reverse coagulopathy (Andexxa 900 mg, Vitamin K 10 mg qday x3)  -NPO  MELD 3.0: 30 at 02/12/2024  8:01 AM MELD-Na: 30 at 02/12/2024  8:01 AM Calculated from: Serum Creatinine: 2.13 mg/dL at 01/22/8118  1:47 AM Serum Sodium: 133 mmol/L at 02/12/2024  7:59 AM Total Bilirubin: 2.6 mg/dL at 06/13/5620  3:08 AM Serum Albumin: 2.5 g/dL at 6/57/8469  6:29 AM INR(ratio): 2.6 at 02/12/2024  8:01 AM Age at listing (hypothetical): 28 years Sex: Male at 02/12/2024  8:01 AM  Renal #AKI on CKD #Metabolic Acidosis  In the setting of hemorrhagic shock from GI bleed. Most recent blood gas with metabolic acidosis (lactic), will optimize ventilation and perfusion and closely trend blood gas. Might benefit from a bicarbonate infusion if acidosis fails to improve.  -ABG PRN -consider bicarbonate gtt  Endocrine  ICU Glycemic protocol, monitor for hypoglycemia  Hem/Onc #Factor V Leiden #Hemorrhagic Shock  On home Apixaban  for factor V Leiden induced budd-chiari, with last  dose this AM. Presents with hemorrhagic shock secondary to variceal bleed and has required massive transfusion protocol for hemodynamic support.  -Andexxa 900 mg once -vitamin K 10 mg daily x 3 -CBC q 6 hours -PRBC for goal hemoglobin of 7 -check fibrinogen  levels  ID  IV ceftriaxone  2 grams daily for SBP prophylaxis   Best Practice (right click and "Reselect all SmartList Selections" daily)   Diet/type: NPO DVT prophylaxis other; active bleed Pressure ulcer(s): N/A GI prophylaxis: PPI Lines: N/A Foley:  Yes, and it is still  needed Code Status:  full code Last date of multidisciplinary goals of care discussion [02/12/2024].  Labs   CBC: Recent Labs  Lab 02/12/24 0759  WBC 13.6*  HGB 7.3*  HCT 25.2*  MCV 86.3  PLT 176    Basic Metabolic Panel: Recent Labs  Lab 02/12/24 0759  NA 133*  K 4.3  CL 108  CO2 19*  GLUCOSE 133*  BUN 35*  CREATININE 2.13*  CALCIUM 8.2*   GFR: Estimated Creatinine Clearance: 35.9 mL/min (A) (by C-G formula based on SCr of 2.13 mg/dL (H)). Recent Labs  Lab 02/12/24 0759  WBC 13.6*  LATICACIDVEN 3.8*    Liver Function Tests: Recent Labs  Lab 02/12/24 0759  AST 43*  ALT 36  ALKPHOS 112  BILITOT 2.6*  PROT 5.7*  ALBUMIN 2.5*   No results for input(s): "LIPASE", "AMYLASE" in the last 168 hours. Recent Labs  Lab 02/12/24 0759  AMMONIA 18    ABG No results found for: "PHART", "PCO2ART", "PO2ART", "HCO3", "TCO2", "ACIDBASEDEF", "O2SAT"   Coagulation Profile: Recent Labs  Lab 02/12/24 0801  INR 2.6*    Cardiac Enzymes: No results for input(s): "CKTOTAL", "CKMB", "CKMBINDEX", "TROPONINI" in the last 168 hours.  HbA1C: Hemoglobin A1C  Date/Time Value Ref Range Status  10/27/2011 02:21 AM 5.3 4.2 - 6.3 % Final    Comment:    The American Diabetes Association recommends that a primary goal of therapy should be <7% and that physicians should reevaluate the treatment regimen in patients with HbA1c values consistently >8%.     CBG: No results for input(s): "GLUCAP" in the last 168 hours.  Review of Systems:   N/A  Past Medical History:  He,  has a past medical history of Cirrhosis (HCC), Factor 5 Leiden mutation, heterozygous (HCC), Pulmonary embolism (HCC), and Sarcoidosis.   Surgical History:   Past Surgical History:  Procedure Laterality Date   CAROTID STENT     ESOPHAGEAL BANDING  10/25/2023   Procedure: ESOPHAGEAL BANDING;  Surgeon: Marnee Sink, MD;  Location: [facility] ENDOSCOPY;  Service: Endoscopy;;   ESOPHAGOGASTRODUODENOSCOPY (EGD)  WITH PROPOFOL  N/A 10/25/2023   Procedure: ESOPHAGOGASTRODUODENOSCOPY (EGD) WITH PROPOFOL ;  Surgeon: Marnee Sink, MD;  Location: [facility] ENDOSCOPY;  Service: Endoscopy;  Laterality: N/A;     Social History:   reports that he has never smoked. He has never used smokeless tobacco. He reports that he does not drink alcohol and does not use drugs.   Family History:  His family history is not on file.   Allergies Allergies  Allergen Reactions   Diphenhydramine Hcl Nausea And Vomiting     Home Medications  Prior to Admission medications   Medication Sig Start Date End Date Taking? Authorizing Provider  carvedilol (COREG) 3.125 MG tablet Take 3.125 mg by mouth 2 (two) times daily with a meal. 12/31/23 12/30/24 Yes [provider]  furosemide  (LASIX ) 20 MG tablet Take 1 tablet by mouth daily. 12/31/23  Yes [provider]  apixaban  (ELIQUIS ) 5 MG TABS tablet Take 5 mg by mouth 2 (two) times daily. 03/21/16   [provider]  benzonatate (TESSALON PERLES) 100 MG capsule Take 1 capsule (100 mg total) by mouth 3 (three) times daily as needed for cough. 02/09/24 02/08/25  Delsie Figures, PA-C  ferrous sulfate  325 (65 FE) MG EC tablet Take 1 tablet (325 mg total) by mouth daily with breakfast. 10/30/23 12/29/23  Donaciano Frizzle, MD  pantoprazole  (PROTONIX ) 40 MG tablet Take 1 tablet (40 mg total) by mouth daily. 10/30/23   Donaciano Frizzle, MD  predniSONE  (STERAPRED UNI-PAK 21 TAB) 10 MG (21) TBPK tablet Take 6 pills on day one then decrease by 1 pill each day 02/09/24   Delsie Figures, PA-C  spironolactone  (ALDACTONE ) 50 MG tablet Take 50 mg by mouth daily.    [provider]     Critical care time: 128 minutes    Additional activities performed during the critical care time included: [x]  Review of recent events [x]  Review of medications, allergies, and vital signs  [x]  Serial data review [x]  Ordering, interpreting, and reviewing diagnostic studies/lab tests [x]  Clinical  examination [x]  High complexity medical decision-making [x]  Communication and coordination with consulting services and multi-disciplinary team focused solely on the patient. I have personally communicated with ED Physician, IR locally, Gastroenterology locally, [facility] for consideration of transfer to [facility], and [facility] transfer center for consideration for transfer to [facility].  This time also includes time spent for documentation. This time does not include time spent in performing any separately billed procedures.   Vergia Glasgow, MD [facility] 02/12/2024 1:16 PM

## 2024-02-12 NOTE — Consult Note (Signed)
 Marnee Sink, MD [facility]  [address] [place], [place] [address] Phone: [phone] Fax : [phone]  Consultation  Referring Provider:     Dr. Synetta Eves Primary Care Physician:  Lonne Roan, MD Primary Gastroenterologist: [facility] GI/hepatology         Reason for Consultation:     Upper GI bleed  Date of Admission:  02/12/2024 Date of Consultation:  02/12/2024         HPI:   Christian Fuentes is a 58 y.o. male with a history of Budd-Chiari and presented with an upper GI bleed.  The patient had presented with hypotension and a history of an EGD earlier this month with banding of esophageal varices.  The patient also has imaging showing that he has had gastric varices also.  The patient had a CT's scan in January that showed:  IMPRESSION: 1. Very severe Cirrhosis (see #3), distal esophageal and gastric Varices. No active gastrointestinal bleeding by CTA, but sensitivity decreased due to the presence of bulky and enhancing mucosal varices.   2. Chronically stented hepatic IVC is patent, but the IVC just caudal to the liver appears atretic and segmentally occluded. The IVC is patent again at the level of the renal veins.   3. Progressively abnormal CT appearance of the liver since 2019. Underlying Liver mass(es) cannot be excluded.   4. Chronic large right inguinal hernia, now containing nonobstructed terminal ileum, cecum, and appendix. Small volume of ascites also within the hernia sac. But no strong evidence of hernia incarceration.   5. No other acute or inflammatory process identified. No atherosclerosis in the abdomen or pelvis.  The patient was noted to have chronic decompensated liver disease due to factor V Leiden.  The patient also has a history of an IVC stent.  There is also noted that the patient had BARTO of his gastric varices in 2019 with hepatic artery aneurysm requiring embolization.  The patient has also been on anticoagulation.  While being  resuscitated in the emergency department after being found to have hypotension due to his hematemesis the patient then started to have more GI bleeding with a reported 1 L of blood in the canister.  The patient was intubated and resuscitation has started again with blood and fluids.  Interventional radiology was contacted for a possible TIPS procedure.  Past Medical History:  Diagnosis Date   Cirrhosis (HCC)    Factor 5 Leiden mutation, heterozygous (HCC)    Pulmonary embolism (HCC)    Sarcoidosis     Past Surgical History:  Procedure Laterality Date   CAROTID STENT     ESOPHAGEAL BANDING  10/25/2023   Procedure: ESOPHAGEAL BANDING;  Surgeon: Marnee Sink, MD;  Location: [facility] ENDOSCOPY;  Service: Endoscopy;;   ESOPHAGOGASTRODUODENOSCOPY (EGD) WITH PROPOFOL  N/A 10/25/2023   Procedure: ESOPHAGOGASTRODUODENOSCOPY (EGD) WITH PROPOFOL ;  Surgeon: Marnee Sink, MD;  Location: [facility] ENDOSCOPY;  Service: Endoscopy;  Laterality: N/A;    Prior to Admission medications   Medication Sig Start Date End Date Taking? Authorizing Provider  carvedilol (COREG) 3.125 MG tablet Take 3.125 mg by mouth 2 (two) times daily with a meal. 12/31/23 12/30/24 Yes [provider]  furosemide  (LASIX ) 20 MG tablet Take 1 tablet by mouth daily. 12/31/23  Yes [provider]  apixaban  (ELIQUIS ) 5 MG TABS tablet Take 5 mg by mouth 2 (two) times daily. 03/21/16   [provider]  benzonatate (TESSALON PERLES) 100 MG capsule Take 1 capsule (100 mg total) by  mouth 3 (three) times daily as needed for cough. 02/09/24 02/08/25  Delsie Figures, PA-C  ferrous sulfate  325 (65 FE) MG EC tablet Take 1 tablet (325 mg total) by mouth daily with breakfast. 10/30/23 12/29/23  Donaciano Frizzle, MD  pantoprazole  (PROTONIX ) 40 MG tablet Take 1 tablet (40 mg total) by mouth daily. 10/30/23   Donaciano Frizzle, MD  predniSONE  (STERAPRED UNI-PAK 21 TAB) 10 MG (21) TBPK tablet Take 6 pills on day one then decrease by 1 pill each day 02/09/24    Delsie Figures, PA-C  spironolactone  (ALDACTONE ) 50 MG tablet Take 50 mg by mouth daily.    [provider]    History reviewed. No pertinent family history.   Social History   Tobacco Use   Smoking status: Never   Smokeless tobacco: Never  Vaping Use   Vaping status: Never Used  Substance Use Topics   Alcohol use: No   Drug use: No    Allergies as of 02/12/2024 - Review Complete 02/12/2024  Allergen Reaction Noted   Diphenhydramine hcl Nausea And Vomiting 12/26/2016    Review of Systems:    All systems reviewed and negative except where noted in HPI.   Physical Exam:  Vital signs in last 24 hours: Temp:  [89.5 F (31.9 C)-97.9 F (36.6 C)] 94.3 F (34.6 C) (04/29 1230) Pulse Rate:  [31-131] 98 (04/29 1230) Resp:  [14-106] 18 (04/29 1230) BP: (41-133)/(28-85) 91/63 (04/29 1230) SpO2:  [94 %-100 %] 97 % (04/29 1230) FiO2 (%):  [40 %] 40 % (04/29 1037) Weight:  [68 kg] 68 kg (04/29 0738)   General:   Intubated and not responding Head: OG tube in place. Eyes:   No icterus.   Conjunctiva pink. PERRLA. Ears:  Normal auditory acuity. Neck:  Supple; no masses or thyroidomegaly Lungs: Respirations even and unlabored. Lungs clear to auscultation bilaterally.   No wheezes, crackles, or rhonchi.  Heart:  Regular rate and rhythm;  Without murmur, clicks, rubs or gallops Abdomen:  Soft, nondistended, mild distention. Normal bowel sounds. No appreciable masses or hepatomegaly.  No rebound or guarding.  Rectal:  Not performed. Msk:  Symmetrical without gross deformities.    Extremities:  Without edema, cyanosis or clubbing. Neurologic: Intubated and unable to assess neurological status. Skin:  Intact without significant lesions or rashes. Cervical Nodes:  No significant cervical adenopathy.  LAB RESULTS: Recent Labs    02/12/24 0759  WBC 13.6*  HGB 7.3*  HCT 25.2*  PLT 176   BMET Recent Labs    02/12/24 0759  NA 133*  K 4.3  CL 108  CO2 19*  GLUCOSE  133*  BUN 35*  CREATININE 2.13*  CALCIUM 8.2*   LFT Recent Labs    02/12/24 0759  PROT 5.7*  ALBUMIN 2.5*  AST 43*  ALT 36  ALKPHOS 112  BILITOT 2.6*   PT/INR Recent Labs    02/12/24 0801  LABPROT 27.9*  INR 2.6*    STUDIES: DG Chest Portable 1 View Result Date: 02/12/2024 CLINICAL DATA:  Intubation EXAM: PORTABLE CHEST - 1 VIEW COMPARISON:  02/12/2024 FINDINGS: Endotracheal tube tip is at the thoracic inlet. Gastric tube extends at least as far as the decompressed stomach, tip not seen. Low lung volumes with infrahilar bronchovascular crowding left greater than right. Heart size and mediastinal contours are within normal limits. No effusion. Embolization coils and venous stents in the visualized upper abdomen. IMPRESSION: 1. Endotracheal tube tip at the thoracic inlet. 2. Low lung volumes with  infrahilar bronchovascular crowding. Electronically Signed   By: Nicoletta Barrier M.D.   On: 02/12/2024 11:53   DG Chest Port 1 View Result Date: 02/12/2024 CLINICAL DATA:  Generalized weakness.  Vomiting. EXAM: PORTABLE CHEST 1 VIEW COMPARISON:  02/09/2024 FINDINGS: Low volume film with similar asymmetric elevation right hemidiaphragm. Streaky density in the lung bases suggests atelectasis although pneumonia cannot be excluded. IVC stent device again noted. The cardiopericardial silhouette is within normal limits for size. No acute bony abnormality. IMPRESSION: Low volume film with streaky density in the lung bases, likely atelectasis. Electronically Signed   By: Donnal Fusi M.D.   On: 02/12/2024 08:17      Impression / Plan:   Assessment: Principal Problem:   Hemorrhagic shock (HCC)   Christian Fuentes is a 58 y.o. y/o male with Budd-Chiari with cirrhosis and a history of gastric varices and esophageal varices with recent banding of the esophageal varices and a presentation with a massive upper GI bleed.  The patient has been intubated and has an OG tube in.  The patient's most recent  blood pH was 7.12.  Plan:  This patient has a very poor prognosis at this time with GI bleeding likely from portal hypertension.  The patient had banding done a few weeks ago.  The patient is high risk for endoscopy at this time with his active GI bleeding and hypertension and would need to be resuscitated and stabilized prior to any GI intervention.  The patient has also been on anticoagulation and this is being reversed at the present time.  Interventional radiology has been involved with the patient's care for possible TIPS versus DIPS. It has been suggested that the patient may do better at a tertiary care center and [facility] is where he has received all of his previous care.  I am told that transfer to [facility] has been requested and we are waiting for response.  In the meantime if the patient is stabilized with stable blood pressure and improved acidosis then we can proceed with an upper endoscopy although this may be complicated by the amount of blood in the stomach and the esophagus limiting visualization.  A Blakemore tube may be needed if the patient should start bleeding again.  I will keep in contact with the ICU team with any further developments.  Thank you for involving me in the care of this patient.      LOS: 0 days   Marnee Sink, MD, MD. Sylvan Evener 02/12/2024, 12:38 PM,  Pager [phone] 7am-5pm  Check AMION for 5pm -7am coverage and on weekends   Note: This dictation was prepared with Dragon dictation along with smaller phrase technology. Any transcriptional errors that result from this process are unintentional.

## 2024-02-12 NOTE — Code Documentation (Signed)
 First RSI medication pushed at this time. Per VO of MD paralytic before sedation medication

## 2024-02-12 NOTE — Consult Note (Signed)
 PHARMACY CONSULT NOTE - ELECTROLYTES  Pharmacy Consult for Electrolyte Monitoring and Replacement   Recent Labs: Height: 5\' 9"  (175.3 cm) Weight: 68 kg (150 lb) IBW/kg (Calculated) : 70.7 Estimated Creatinine Clearance: 35.9 mL/min (A) (by C-G formula based on SCr of 2.13 mg/dL (H)). Potassium (mmol/L)  Date Value  02/12/2024 4.3  03/18/2012 4.0   Magnesium (mg/dL)  Date Value  40/98/1191 2.3   Calcium (mg/dL)  Date Value  47/82/9562 8.2 (L)   Calcium, Total (mg/dL)  Date Value  13/05/6577 8.7   Albumin (g/dL)  Date Value  46/96/2952 2.5 (L)  03/18/2012 3.5   Sodium (mmol/L)  Date Value  02/12/2024 133 (L)  03/18/2012 143    Assessment  Christian Fuentes is a 58 y.o. male presenting with hematemesis. 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 Pharmacy has been consulted to monitor and replace electrolytes.  Diet: npo MIVF: NS @ 10 mL/hr   Goal of Therapy: Electrolytes WNL  Plan:  No electrolytes replacement needed at this time. Check BMP, Mg, Phos with AM labs  Thank you for allowing pharmacy to be a part of this patient's care.  Jag Lenz Rodriguez-Guzman PharmD, BCPS 02/12/2024 12:04 PM

## 2024-02-13 LAB — TYPE AND SCREEN
ABO/RH(D): A POS
Antibody Screen: NEGATIVE
Unit division: 0
Unit division: 0
Unit division: 0
Unit division: 0
Unit division: 0
Unit division: 0
Unit division: 0
Unit division: 0
Unit division: 0
Unit division: 0
Unit division: 0
Unit division: 0
Unit division: 0
Unit division: 0
Unit division: 0
Unit division: 0
Unit division: 0
Unit division: 0
Unit division: 0
Unit division: 0

## 2024-02-13 LAB — BPAM RBC
Blood Product Expiration Date: 202505142359
Blood Product Expiration Date: 202505142359
Blood Product Expiration Date: 202505142359
Blood Product Expiration Date: 202505202359
Blood Product Expiration Date: 202505312359
Blood Product Expiration Date: 202505312359
Blood Product Expiration Date: 202505312359
Blood Product Expiration Date: 202505312359
Blood Product Expiration Date: 202505312359
Blood Product Expiration Date: 202506012359
Blood Product Expiration Date: 202506012359
Blood Product Expiration Date: 202506022359
Blood Product Expiration Date: 202506022359
Blood Product Expiration Date: 202506022359
Blood Product Expiration Date: 202506022359
Blood Product Expiration Date: 202506022359
Blood Product Expiration Date: 202506032359
Blood Product Expiration Date: 202506032359
Blood Product Expiration Date: 202506042359
Blood Product Expiration Date: 202506042359
ISSUE DATE / TIME: 202504290907
ISSUE DATE / TIME: 202504290907
ISSUE DATE / TIME: 202504291041
ISSUE DATE / TIME: 202504291051
ISSUE DATE / TIME: 202504291051
ISSUE DATE / TIME: 202504291439
ISSUE DATE / TIME: 202504291453
ISSUE DATE / TIME: 202504291453
ISSUE DATE / TIME: 202504291508
ISSUE DATE / TIME: 202504291508
ISSUE DATE / TIME: 202504291508
ISSUE DATE / TIME: 202504291508
ISSUE DATE / TIME: 202504291520
ISSUE DATE / TIME: 202504291520
ISSUE DATE / TIME: 202504291520
ISSUE DATE / TIME: 202504291520
Unit Type and Rh: 600
Unit Type and Rh: 600
Unit Type and Rh: 600
Unit Type and Rh: 600
Unit Type and Rh: 6200
Unit Type and Rh: 6200
Unit Type and Rh: 6200
Unit Type and Rh: 6200
Unit Type and Rh: 6200
Unit Type and Rh: 6200
Unit Type and Rh: 6200
Unit Type and Rh: 6200
Unit Type and Rh: 6200
Unit Type and Rh: 6200
Unit Type and Rh: 6200
Unit Type and Rh: 6200
Unit Type and Rh: 6200
Unit Type and Rh: 6200
Unit Type and Rh: 6200
Unit Type and Rh: 6200

## 2024-02-13 LAB — BPAM FFP
Blood Product Expiration Date: 202504292359
Blood Product Expiration Date: 202504292359
Blood Product Expiration Date: 202505032359
Blood Product Expiration Date: 202505032359
Blood Product Expiration Date: 202505042359
Blood Product Expiration Date: 202505042359
Blood Product Expiration Date: 202505042359
Blood Product Expiration Date: 202505042359
ISSUE DATE / TIME: 202504291103
ISSUE DATE / TIME: 202504291103
ISSUE DATE / TIME: 202504291103
ISSUE DATE / TIME: 202504291103
ISSUE DATE / TIME: 202504291520
ISSUE DATE / TIME: 202504291520
ISSUE DATE / TIME: 202504291520
ISSUE DATE / TIME: 202504291520
Unit Type and Rh: 6200
Unit Type and Rh: 6200
Unit Type and Rh: 6200
Unit Type and Rh: 6200
Unit Type and Rh: 6200
Unit Type and Rh: 6200
Unit Type and Rh: 6200
Unit Type and Rh: 6200

## 2024-02-13 LAB — PREPARE FRESH FROZEN PLASMA
Unit division: 0
Unit division: 0
Unit division: 0
Unit division: 0

## 2024-02-13 LAB — PREPARE CRYOPRECIPITATE: Unit division: 0

## 2024-02-13 LAB — PREPARE PLATELET PHERESIS: Unit division: 0

## 2024-02-13 LAB — BPAM CRYOPRECIPITATE
Blood Product Expiration Date: 202504292048
ISSUE DATE / TIME: 202504291511
Unit Type and Rh: 6200

## 2024-02-13 LAB — PREPARE RBC (CROSSMATCH)

## 2024-02-13 LAB — BPAM PLATELET PHERESIS
Blood Product Expiration Date: 202504302359
Unit Type and Rh: 5100

## 2024-02-13 LAB — MASSIVE TRANSFUSION PROTOCOL ORDER (BLOOD BANK NOTIFICATION)

## 2024-03-16 DEATH — deceased
